# Patient Record
Sex: Male | Born: 1965 | Race: White | Hispanic: No | Marital: Single | Smoking: Current every day smoker
Health system: Southern US, Community
[De-identification: ages and names within clinical notes are randomized; demographics above are authoritative.]

## PROBLEM LIST (undated history)

## (undated) ENCOUNTER — Emergency Department (HOSPITAL_COMMUNITY): Admission: EM | Payer: MEDICAID | Source: Home / Self Care

## (undated) DIAGNOSIS — Z789 Other specified health status: Secondary | ICD-10-CM

## (undated) DIAGNOSIS — J302 Other seasonal allergic rhinitis: Secondary | ICD-10-CM

---

## 2012-02-03 ENCOUNTER — Emergency Department (HOSPITAL_COMMUNITY)
Admission: EM | Admit: 2012-02-03 | Discharge: 2012-02-03 | Discharge status: Against Medical Advice | Payer: Self-pay | Attending: Emergency Medicine | Admitting: Emergency Medicine

## 2012-02-03 ENCOUNTER — Encounter (HOSPITAL_COMMUNITY): Payer: Self-pay | Admitting: *Deleted

## 2012-02-03 DIAGNOSIS — W19XXXA Unspecified fall, initial encounter: Secondary | ICD-10-CM | POA: Insufficient documentation

## 2012-02-03 DIAGNOSIS — S91009A Unspecified open wound, unspecified ankle, initial encounter: Secondary | ICD-10-CM | POA: Insufficient documentation

## 2012-02-03 DIAGNOSIS — S81009A Unspecified open wound, unspecified knee, initial encounter: Secondary | ICD-10-CM | POA: Insufficient documentation

## 2012-02-03 HISTORY — DX: Other seasonal allergic rhinitis: J30.2

## 2012-02-03 NOTE — ED Notes (Signed)
Pt fell tonight, cannot say on what.  ETOH tonight, pt states he has had 6 beers.

## 2012-02-03 NOTE — ED Notes (Signed)
Off duty officer states he saw the patient on the road, did not come back to ED.

## 2015-09-17 ENCOUNTER — Emergency Department (HOSPITAL_COMMUNITY): Admission: EM | Admit: 2015-09-17 | Discharge: 2015-09-18 | Discharge status: Against Medical Advice | Payer: Self-pay

## 2015-09-17 NOTE — ED Notes (Signed)
2nd call for pt with no response. 

## 2015-09-17 NOTE — ED Notes (Signed)
3rd call for pt with no response. 

## 2015-09-17 NOTE — ED Notes (Addendum)
Patient presents from home via EMS left flank pain, fell 6 days ago, ETOH on board. Patient ambulatory to triage.   Last VS: 128/92, 94hr, 100%ra, 20resp.

## 2015-09-19 ENCOUNTER — Emergency Department (HOSPITAL_COMMUNITY)
Admission: EM | Admit: 2015-09-19 | Discharge: 2015-09-19 | Disposition: A | Discharge status: Home / Self Care | Payer: Self-pay

## 2015-09-19 ENCOUNTER — Encounter (HOSPITAL_COMMUNITY): Payer: Self-pay | Admitting: Emergency Medicine

## 2015-09-19 NOTE — ED Notes (Signed)
Per ems, people around called ems for the patient. He fell off porch and landed on his left side a week ago. He came to the ER and was not seen by provider. Friends today called EMS for shortness of breath. Patient reported to be alert and oriented. Non-verbal on arrival. While speaking with EMS, patient exited stretcher, and rapidly walked the ED department, stating "I have to leave now". Inquired where he was going and how he was getting there, and if he would rather see the doctor first before going. Patient declines and repeatedly asked to leave. Assistance with security and [employer]. Patient signed himself out AMA.

## 2018-11-06 ENCOUNTER — Emergency Department (HOSPITAL_COMMUNITY): Payer: Self-pay

## 2018-11-06 ENCOUNTER — Inpatient Hospital Stay (HOSPITAL_COMMUNITY)
Admission: EM | Admit: 2018-11-06 | Discharge: 2018-11-07 | DRG: 896 | Disposition: A | Discharge status: Home / Self Care | Payer: Self-pay | Attending: Internal Medicine | Admitting: Internal Medicine

## 2018-11-06 DIAGNOSIS — F10929 Alcohol use, unspecified with intoxication, unspecified: Secondary | ICD-10-CM

## 2018-11-06 DIAGNOSIS — Z1159 Encounter for screening for other viral diseases: Secondary | ICD-10-CM

## 2018-11-06 DIAGNOSIS — J439 Emphysema, unspecified: Secondary | ICD-10-CM | POA: Diagnosis present

## 2018-11-06 DIAGNOSIS — R9389 Abnormal findings on diagnostic imaging of other specified body structures: Secondary | ICD-10-CM

## 2018-11-06 DIAGNOSIS — R402310 Coma scale, best motor response, none, unspecified time: Secondary | ICD-10-CM | POA: Diagnosis present

## 2018-11-06 DIAGNOSIS — R402210 Coma scale, best verbal response, none, unspecified time: Secondary | ICD-10-CM | POA: Diagnosis present

## 2018-11-06 DIAGNOSIS — F10129 Alcohol abuse with intoxication, unspecified: Principal | ICD-10-CM | POA: Diagnosis present

## 2018-11-06 DIAGNOSIS — Y908 Blood alcohol level of 240 mg/100 ml or more: Secondary | ICD-10-CM | POA: Diagnosis present

## 2018-11-06 DIAGNOSIS — R4182 Altered mental status, unspecified: Secondary | ICD-10-CM | POA: Diagnosis present

## 2018-11-06 DIAGNOSIS — E876 Hypokalemia: Secondary | ICD-10-CM | POA: Diagnosis present

## 2018-11-06 DIAGNOSIS — F10921 Alcohol use, unspecified with intoxication delirium: Secondary | ICD-10-CM

## 2018-11-06 DIAGNOSIS — J810 Acute pulmonary edema: Secondary | ICD-10-CM | POA: Diagnosis present

## 2018-11-06 DIAGNOSIS — J9601 Acute respiratory failure with hypoxia: Secondary | ICD-10-CM

## 2018-11-06 DIAGNOSIS — R0603 Acute respiratory distress: Secondary | ICD-10-CM | POA: Diagnosis present

## 2018-11-06 DIAGNOSIS — R402110 Coma scale, eyes open, never, unspecified time: Secondary | ICD-10-CM | POA: Diagnosis present

## 2018-11-06 LAB — COMPREHENSIVE METABOLIC PANEL
ALT: 50 U/L — ABNORMAL HIGH (ref 0–44)
AST: 117 U/L — ABNORMAL HIGH (ref 15–41)
Albumin: 3.8 g/dL (ref 3.5–5.0)
Alkaline Phosphatase: 86 U/L (ref 38–126)
Anion gap: 13 (ref 5–15)
BUN: <5 mg/dL — ABNORMAL LOW (ref 6–20)
CO2: 19 mmol/L — ABNORMAL LOW (ref 22–32)
Calcium: 8.8 mg/dL — ABNORMAL LOW (ref 8.9–10.3)
Chloride: 107 mmol/L (ref 98–111)
Creatinine, Ser: 0.69 mg/dL (ref 0.61–1.24)
GFR calc Af Amer: >60 mL/min (ref >60)
GFR calc non Af Amer: >60 mL/min (ref >60)
Glucose, Bld: 93 mg/dL (ref 70–99)
Potassium: 5 mmol/L (ref 3.5–5.1)
Sodium: 139 mmol/L (ref 135–145)
Total Bilirubin: 0.8 mg/dL (ref 0.3–1.2)
Total Protein: 7.7 g/dL (ref 6.5–8.1)

## 2018-11-06 LAB — CBC WITH DIFFERENTIAL/PLATELET
Abs Immature Granulocytes: 0.11 10*3/uL — ABNORMAL HIGH (ref 0.00–0.07)
Basophils Absolute: 0.1 10*3/uL (ref 0.0–0.1)
Basophils Relative: 1 %
Eosinophils Absolute: 0.2 10*3/uL (ref 0.0–0.5)
Eosinophils Relative: 3 %
HCT: 49.7 % (ref 39.0–52.0)
Hemoglobin: 16.3 g/dL (ref 13.0–17.0)
Immature Granulocytes: 2 %
Lymphocytes Relative: 40 %
Lymphs Abs: 2.9 10*3/uL (ref 0.7–4.0)
MCH: 34.5 pg — ABNORMAL HIGH (ref 26.0–34.0)
MCHC: 32.8 g/dL (ref 30.0–36.0)
MCV: 105.1 fL — ABNORMAL HIGH (ref 80.0–100.0)
Monocytes Absolute: 1.4 10*3/uL — ABNORMAL HIGH (ref 0.1–1.0)
Monocytes Relative: 19 %
Neutro Abs: 2.6 10*3/uL (ref 1.7–7.7)
Neutrophils Relative %: 35 %
Platelets: DECREASED 10*3/uL (ref 150–400)
RBC: 4.73 MIL/uL (ref 4.22–5.81)
RDW: 13.9 % (ref 11.5–15.5)
WBC: 7.3 10*3/uL (ref 4.0–10.5)
nRBC: 0 % (ref 0.0–0.2)

## 2018-11-06 LAB — POCT I-STAT 7, (LYTES, BLD GAS, ICA,H+H)
Acid-base deficit: 4 mmol/L — ABNORMAL HIGH (ref 0.0–2.0)
Acid-base deficit: 9 mmol/L — ABNORMAL HIGH (ref 0.0–2.0)
Bicarbonate: 23 mmol/L (ref 20.0–28.0)
Bicarbonate: 16.4 mmol/L — ABNORMAL LOW (ref 20.0–28.0)
Calcium, Ion: 1.11 mmol/L — ABNORMAL LOW (ref 1.15–1.40)
Calcium, Ion: 1.07 mmol/L — ABNORMAL LOW (ref 1.15–1.40)
HCT: 50 % (ref 39.0–52.0)
HCT: 43 % (ref 39.0–52.0)
Hemoglobin: 17 g/dL (ref 13.0–17.0)
Hemoglobin: 14.6 g/dL (ref 13.0–17.0)
O2 Saturation: 100 %
O2 Saturation: 99 %
Patient temperature: 91.4
Patient temperature: 97.1
Potassium: 3.7 mmol/L (ref 3.5–5.1)
Potassium: 3.5 mmol/L (ref 3.5–5.1)
Sodium: 140 mmol/L (ref 135–145)
Sodium: 144 mmol/L (ref 135–145)
TCO2: 24 mmol/L (ref 22–32)
TCO2: 17 mmol/L — ABNORMAL LOW (ref 22–32)
pCO2 arterial: 41.4 mmHg (ref 32.0–48.0)
pCO2 arterial: 32.1 mmHg (ref 32.0–48.0)
pH, Arterial: 7.331 — ABNORMAL LOW (ref 7.350–7.450)
pH, Arterial: 7.314 — ABNORMAL LOW (ref 7.350–7.450)
pO2, Arterial: 426 mmHg — ABNORMAL HIGH (ref 83.0–108.0)
pO2, Arterial: 159 mmHg — ABNORMAL HIGH (ref 83.0–108.0)

## 2018-11-06 LAB — RAPID URINE DRUG SCREEN, HOSP PERFORMED
Amphetamines: NOT DETECTED
Barbiturates: NOT DETECTED
Benzodiazepines: NOT DETECTED
Cocaine: NOT DETECTED
Opiates: NOT DETECTED
Tetrahydrocannabinol: NOT DETECTED

## 2018-11-06 LAB — CBG MONITORING, ED
Glucose-Capillary: 112 mg/dL — ABNORMAL HIGH (ref 70–99)
Glucose-Capillary: 98 mg/dL (ref 70–99)

## 2018-11-06 LAB — TRIGLYCERIDES: Triglycerides: 47 mg/dL (ref <150)

## 2018-11-06 LAB — ETHANOL: Alcohol, Ethyl (B): 415 mg/dL (ref <10)

## 2018-11-06 LAB — SARS CORONAVIRUS 2: SARS Coronavirus 2: NOT DETECTED

## 2018-11-06 MED ORDER — THIAMINE HCL 100 MG/ML IJ SOLN
Freq: Once | INTRAVENOUS | Status: AC
  Administered 2018-11-06: 20:00:00 via INTRAVENOUS
  Filled 2018-11-06: qty 1000

## 2018-11-06 MED ORDER — DEXMEDETOMIDINE HCL IN NACL 200 MCG/50ML IV SOLN
0.2000 ug/kg/h | INTRAVENOUS | Status: DC
  Administered 2018-11-06 (×2): 0.2 ug/kg/h via INTRAVENOUS
  Administered 2018-11-07: 03:00:00 0.271 ug/kg/h via INTRAVENOUS
  Filled 2018-11-06 (×4): qty 50

## 2018-11-06 MED ORDER — FOLIC ACID 1 MG PO TABS
1.0000 mg | ORAL_TABLET | Freq: Every day | ORAL | Status: DC
  Administered 2018-11-07: 11:00:00 1 mg
  Filled 2018-11-06: qty 1

## 2018-11-06 MED ORDER — PROPOFOL 1000 MG/100ML IV EMUL: 0.0000 ug/kg/min | INTRAVENOUS | Status: DC

## 2018-11-06 MED ORDER — FENTANYL BOLUS VIA INFUSION
50.0000 ug | INTRAVENOUS | Status: DC | PRN
  Filled 2018-11-06: qty 50

## 2018-11-06 MED ORDER — FENTANYL CITRATE (PF) 100 MCG/2ML IJ SOLN: 100.0000 ug | INTRAMUSCULAR | Status: DC | PRN

## 2018-11-06 MED ORDER — LORAZEPAM 2 MG/ML IJ SOLN
1.0000 mg | INTRAMUSCULAR | Status: DC | PRN
  Administered 2018-11-06: 2 mg via INTRAVENOUS
  Filled 2018-11-06: qty 1

## 2018-11-06 MED ORDER — ETOMIDATE 2 MG/ML IV SOLN
INTRAVENOUS | Status: AC | PRN
  Administered 2018-11-06: 20 mg via INTRAVENOUS

## 2018-11-06 MED ORDER — FENTANYL CITRATE (PF) 100 MCG/2ML IJ SOLN
50.0000 ug | Freq: Once | INTRAMUSCULAR | Status: AC
  Administered 2018-11-06: 15:00:00 50 ug via INTRAVENOUS

## 2018-11-06 MED ORDER — ADULT MULTIVITAMIN W/MINERALS CH
1.0000 | ORAL_TABLET | Freq: Every day | ORAL | Status: DC
  Administered 2018-11-07: 11:00:00 1
  Filled 2018-11-06: qty 1

## 2018-11-06 MED ORDER — NOREPINEPHRINE 4 MG/250ML-% IV SOLN
0.0000 ug/min | INTRAVENOUS | Status: DC
  Administered 2018-11-06: 15:00:00 2 ug/min via INTRAVENOUS

## 2018-11-06 MED ORDER — CALCIUM GLUCONATE-NACL 1-0.675 GM/50ML-% IV SOLN
1.0000 g | Freq: Once | INTRAVENOUS | Status: AC
  Administered 2018-11-06: 16:00:00 1000 mg via INTRAVENOUS
  Filled 2018-11-06: qty 50

## 2018-11-06 MED ORDER — SODIUM CHLORIDE 0.9 % IV SOLN
INTRAVENOUS | Status: DC
  Administered 2018-11-06: 12:00:00 via INTRAVENOUS

## 2018-11-06 MED ORDER — VITAMIN B-1 100 MG PO TABS
100.0000 mg | ORAL_TABLET | Freq: Every day | ORAL | Status: DC
  Administered 2018-11-07: 11:00:00 100 mg
  Filled 2018-11-06: qty 1

## 2018-11-06 MED ORDER — FENTANYL 2500MCG IN NS 250ML (10MCG/ML) PREMIX INFUSION
25.0000 ug/h | INTRAVENOUS | Status: DC
  Administered 2018-11-06: 15:00:00 50 ug/h via INTRAVENOUS
  Filled 2018-11-06: qty 250

## 2018-11-06 MED ORDER — MIDAZOLAM HCL 2 MG/2ML IJ SOLN
1.0000 mg | INTRAMUSCULAR | Status: DC | PRN
  Administered 2018-11-06: 16:00:00 2 mg via INTRAVENOUS
  Filled 2018-11-06: qty 2

## 2018-11-06 MED ORDER — PANTOPRAZOLE SODIUM 40 MG IV SOLR
40.0000 mg | Freq: Every day | INTRAVENOUS | Status: DC
  Administered 2018-11-06: 22:00:00 40 mg via INTRAVENOUS
  Filled 2018-11-06: qty 40

## 2018-11-06 MED ORDER — ROCURONIUM BROMIDE 50 MG/5ML IV SOLN
INTRAVENOUS | Status: AC | PRN
  Administered 2018-11-06: 80 mg via INTRAVENOUS

## 2018-11-06 MED ORDER — HEPARIN SODIUM (PORCINE) 5000 UNIT/ML IJ SOLN
5000.0000 [IU] | Freq: Three times a day (TID) | INTRAMUSCULAR | Status: DC
  Administered 2018-11-06 – 2018-11-07 (×2): 5000 [IU] via SUBCUTANEOUS
  Filled 2018-11-06 (×2): qty 1

## 2018-11-06 NOTE — ED Provider Notes (Signed)
[facility]   CSN: [id number] Arrival date & time: 11/06/18  1133     History   Chief Complaint Chief Complaint  Patient presents with  . Unresponsive    HPI Randall Garcia is a [age] y.o. male.     This is a homeless male who presents with via EMS due to being found unresponsive.  Patient is a known alcoholic and was last seen drinking alcohol sometime ago.  Bystander called EMS due to not be able to arouse him.  Per EMS patient CBG was above 100.  The patient had a nasopharyngeal airway inserted and pulse oximetry and blood pressure and heart rate were stable.  Transported here for further management     No past medical history on file.  There are no active problems to display for this patient.         Home Medications    Prior to Admission medications   Not on File    Family History No family history on file.  Social History Social History   Tobacco Use  . Smoking status: Not on file  Substance Use Topics  . Alcohol use: Not on file  . Drug use: Not on file     Allergies   Patient has no known allergies.   Review of Systems Review of Systems  Unable to perform ROS: Acuity of condition     Physical Exam Updated Vital Signs BP (!) 159/100   Pulse 88   Temp (!) 93.2 F (34 C)   Resp 18   SpO2 100%   Physical Exam Vitals signs and nursing note reviewed.  Constitutional:      General: He is not in acute distress.    Appearance: Normal appearance. He is well-developed. He is not toxic-appearing.  HENT:     Head: Normocephalic and atraumatic.  Eyes:     General: Lids are normal.     Conjunctiva/sclera: Conjunctivae normal.     Pupils: Pupils are equal, round, and reactive to light.  Neck:     Musculoskeletal: Normal range of motion and neck supple.     Thyroid: No thyroid mass.     Trachea: No tracheal deviation.  Cardiovascular:     Rate and Rhythm: Regular rhythm. Tachycardia  present.     Heart sounds: Normal heart sounds. No murmur. No gallop.   Pulmonary:     Effort: Pulmonary effort is normal. No respiratory distress.     Breath sounds: Normal breath sounds. No stridor. No decreased breath sounds, wheezing, rhonchi or rales.  Abdominal:     General: Bowel sounds are normal. There is no distension.     Palpations: Abdomen is soft.     Tenderness: There is no abdominal tenderness. There is no rebound.  Musculoskeletal: Normal range of motion.        General: No tenderness.  Skin:    General: Skin is warm and dry.     Findings: No abrasion or rash.  Neurological:     Mental Status: He is unresponsive.     GCS: GCS eye subscore is 1. GCS verbal subscore is 1. GCS motor subscore is 1.  Psychiatric:        Attention and Perception: He is inattentive.      ED Treatments / Results  Labs (all labs ordered are listed, but only abnormal results are displayed) Labs Reviewed  SARS CORONAVIRUS 2 (HOSPITAL ORDER, PERFORMED IN [facility])  CBC WITH  DIFFERENTIAL/PLATELET  COMPREHENSIVE METABOLIC PANEL  ETHANOL  RAPID URINE DRUG SCREEN, HOSP PERFORMED  BLOOD GAS, ARTERIAL  CBG MONITORING, ED    EKG    Radiology No results found.  Procedures Procedure Name: Intubation Date/Time: 11/06/2018 12:38 PM Performed by: Lacretia Leigh, MD Pre-anesthesia Checklist: Patient identified, Patient being monitored, Emergency Drugs available, Timeout performed and Suction available Oxygen Delivery Method: Non-rebreather mask Preoxygenation: Pre-oxygenation with 100% oxygen Induction Type: Rapid sequence Ventilation: Mask ventilation without difficulty Laryngoscope Size: Glidescope and 2 Grade View: Grade I Tube size: 7.5 mm Number of attempts: 1 Airway Equipment and Method: Video-laryngoscopy Placement Confirmation: ETT inserted through vocal cords under direct vision,  CO2 detector and Breath sounds checked- equal and bilateral Secured at: 26  cm Tube secured with: ETT holder Dental Injury: Teeth and Oropharynx as per pre-operative assessment  Difficulty Due To: Difficulty was anticipated      (including critical care time)  Medications Ordered in ED Medications  0.9 %  sodium chloride infusion ( Intravenous New Bag/Given 11/06/18 1150)     Initial Impression / Assessment and Plan / ED Course  I have reviewed the triage vital signs and the nursing notes.  Pertinent labs & imaging results that were available during my care of the patient were reviewed by me and considered in my medical decision making (see chart for details).        Patient arrived by EMS with unresponsiveness.  Patient had a blood gas which showed no signs of CO2 retention and adequate oxygenation.  Attempted to take patient off oxygen and he then desatted.  He was bagged by nursing easily.  Sats became stable once more.  Decided to intubate the patient for airway protection.  Please see above note.  Patient's COVID test is negative.  His alcohol level is severely elevated at 415.  Will check head and C-spine CTs.  Will consult critical care for admission   CRITICAL CARE Performed by: Leota Jacobsen Total critical care time: 65 minutes Critical care time was exclusive of separately billable procedures and treating other patients. Critical care was necessary to treat or prevent imminent or life-threatening deterioration. Critical care was time spent personally by me on the following activities: development of treatment plan with patient and/or surrogate as well as nursing, discussions with consultants, evaluation of patient's response to treatment, examination of patient, obtaining history from patient or surrogate, ordering and performing treatments and interventions, ordering and review of laboratory studies, ordering and review of radiographic studies, pulse oximetry and re-evaluation of patient's condition.  Randall Garcia was evaluated in Emergency  Department on 11/06/2018 for the symptoms described in the history of present illness. He was evaluated in the context of the global COVID-19 pandemic, which necessitated consideration that the patient might be at risk for infection with the SARS-CoV-2 virus that causes COVID-19. Institutional protocols and algorithms that pertain to the evaluation of patients at risk for COVID-19 are in a state of rapid change based on information released by regulatory bodies including the CDC and federal and state organizations. These policies and algorithms were followed during the patient's care in the ED.    Final Clinical Impressions(s) / ED Diagnoses   Final diagnoses:  None    ED Discharge Orders    None       Lacretia Leigh, MD 11/06/18 1326

## 2018-11-06 NOTE — Code Documentation (Signed)
PREPARING FOR INTUBATION

## 2018-11-06 NOTE — H&P (Signed)
NAME:  Randall Garcia, MRN:  [id number], DOB:  12/15/1965, LOS: 0 ADMISSION DATE:  11/06/2018, CONSULTATION DATE:  11/06/18 REFERRING MD:  Zenia Resides  CHIEF COMPLAINT:  AMS   Brief History   Randall Garcia is a 53 y.o. male who is homeless and was brought to ED after being found unresponsive.  He required intubation for airway protection.  History of present illness   Pt is encephelopathic; therefore, this HPI is obtained from chart review. Randall Garcia is a 53 y.o. male who has no known significant PMH and who is homeless.  He presented to [facility] ED 6/16 after being found unresponsive.  He was last seen normal 1 hour prior when he was seen drinking some alcohol.  He was apparently found by his girlfriend who called EMS and upon their arrival, he was agonal.  He was brought to the ED where he was intubated for airway protection.  CT head and c-spine were negative for acute process though did show emphysema as well as incidental finding of irregular nodules in the apices.  Past Medical History  Emphysema per CT, irregular pulmonary nodules.  Significant Hospital Events   6/16 > admit.  Consults:  None.  Procedures:  ETT 6/16 >   Significant Diagnostic Tests:  CT head / c-spine 6/16 > negative for acute process though did show emphysema as well as incidental finding of irregular nodules in the apices.  Micro Data:  SARS CoV2 6/16 > neg. Sputum 6/16 >   Antimicrobials:  None.   Interim history/subjective:  Sedated, comfortable.  Objective:  Blood pressure 95/66, pulse 91, temperature (!) 95.2 F (35.1 C), resp. rate 16, height 6' (1.829 m), SpO2 100 %.    Vent Mode: PRVC FiO2 (%):  [60 %] 60 % Set Rate:  [16 bmp] 16 bmp Vt Set:  [620 mL] 620 mL PEEP:  [5 cmH20] 5 cmH20 Plateau Pressure:  [9 cmH20] 9 cmH20  No intake or output data in the 24 hours ending 11/06/18 1443 There were no vitals filed for this visit.  Examination: General: Adult male, disheveled, in NAD. Neuro: Sedated,  unresponsive.  Withdraws to pain. HEENT: [place]/AT. Sclerae anicteric.  ETT in place. Cardiovascular: RRR, no M/R/G.  Lungs: Respirations even and unlabored.  CTA bilaterally, No W/R/R.  Abdomen: BS x 4, soft, NT/ND.  Musculoskeletal: No gross deformities, no edema.  Skin: Poor hygiene most notably to bilateral lower feet, warm, no rashes.  Assessment & Plan:   Respiratory insufficiency - due to inability to protect the airway in the setting of AMS likely 2/2 to EtOH intoxication. - Full vent support. - Assess ABG. - Wean as mental status allows. - Daily SBT. - Bronchial hygiene. - Follow CXR.  AMS - unclear etiology but favor EtOH intoxication (EtOH level 415 on admit).  UDS and CT head negative. - Supportive care. - Precedex gtt / Midazolam PRN. - RASS goal 0 to -1. - Thiamine / Folate / Multivitamin. - EtOH cessation counseling.  Hypocalcemia. - 1g Ca gluconate.   Best Practice:  Diet: NPO. Pain/Anxiety/Delirium protocol (if indicated): Precedex gtt / Fentanyl PRN / Midazolam PRN.  RASS goal -1. VAP protocol (if indicated): In place. DVT prophylaxis: SCD's / Heparin. GI prophylaxis: PPI. Glucose control: None. Mobility: Bedrest. Code Status: Full. Family Communication: None available. Disposition: ICU.  Labs   CBC: Recent Labs  Lab 11/06/18 1150 11/06/18 1156  WBC 7.3  --   NEUTROABS 2.6  --   HGB 16.3 17.0  HCT 49.7 50.0  MCV 105.1*  --   PLT PLATELET CLUMPS NOTED ON SMEAR, COUNT APPEARS DECREASED  --    Basic Metabolic Panel: Recent Labs  Lab 11/06/18 1150 11/06/18 1156  NA 139 140  K 5.0 3.7  CL 107  --   CO2 19*  --   GLUCOSE 93  --   BUN <5*  --   CREATININE 0.69  --   CALCIUM 8.8*  --    GFR: CrCl cannot be calculated (Unknown ideal weight.). Recent Labs  Lab 11/06/18 1150  WBC 7.3   Liver Function Tests: Recent Labs  Lab 11/06/18 1150  AST 117*  ALT 50*  ALKPHOS 86  BILITOT 0.8  PROT 7.7  ALBUMIN 3.8   No results for  input(s): LIPASE, AMYLASE in the last 168 hours. No results for input(s): AMMONIA in the last 168 hours. ABG    Component Value Date/Time   PHART 7.331 (L) 11/06/2018 1156   PCO2ART 41.4 11/06/2018 1156   PO2ART 426.0 (H) 11/06/2018 1156   HCO3 23.0 11/06/2018 1156   TCO2 24 11/06/2018 1156   ACIDBASEDEF 4.0 (H) 11/06/2018 1156   O2SAT 100.0 11/06/2018 1156    Coagulation Profile: No results for input(s): INR, PROTIME in the last 168 hours. Cardiac Enzymes: No results for input(s): CKTOTAL, CKMB, CKMBINDEX, TROPONINI in the last 168 hours. HbA1C: No results found for: HGBA1C CBG: Recent Labs  Lab 11/06/18 1145 11/06/18 1220  GLUCAP 98 112*    Review of Systems:   Unable to obtain as pt is encephalopathic.  Past medical history  He,  has no past medical history on file.   Surgical History   Not known  Social History      Family history   His family history is not on file.   Allergies No Known Allergies   Home meds  Prior to Admission medications   Not on File    Critical care time: 30 min.    [name], PA [facility] Pager: [phone].  If no answer, [phone] 11/06/2018, 2:43 PM

## 2018-11-06 NOTE — ED Notes (Signed)
ED TO INPATIENT HANDOFF REPORT  ED Nurse Name and Phone #: Merry ProudBrandi [phone]  S Name/Age/Gender Randall Garcia 53 y.o. male Room/Bed: TRACC/TRACC  Code Status   Code Status: Full Code  Home/SNF/Other Home/Homeless Patient oriented to:  disoriented, intubated.  Is this baseline? No   Triage Complete: Triage complete  Chief Complaint unresponsive  Triage Note Patient presents to the ED by EMS with unresponsive pt. LSW 1 hour ago, known hx of ETOH last drink 1 hr ago. Found by bystander (girlfriend). Pupils dilated 5/6. Initially agonal respirations being assisted by bag now on NRB. No trauma noted.    Allergies No Known Allergies  Level of Care/Admitting Diagnosis ED Disposition    ED Disposition Condition Comment   Admit  [facility]ospital [facility] [[id number]]  Level of Care: ICU [6]  Covid Evaluation: Confirmed COVID Negative  Diagnosis: Altered mental status [780.97.ICD-9-CM]  Admitting Physician: Lorin GlassSMITH, DANIEL C [[id number]]  Attending Physician: PCCM, MD [phone][[id number]]  Estimated length of stay: 3 - 4 days  Certification:: I certify this patient will need inpatient services for at least 2 midnights  PT Class (Do Not Modify): Inpatient [101]  PT Acc Code (Do Not Modify): Private [1]       B Medical/Surgery History No past medical history on file.    A IV Location/Drains/Wounds Patient Lines/Drains/Airways Status   Active Line/Drains/Airways    Name:   Placement date:   Placement time:   Site:   Days:   Peripheral IV 11/06/18 Left Antecubital   11/06/18    1141    Antecubital   less than 1   Peripheral IV 11/06/18 Right Hand   11/06/18    1142    Hand   less than 1   NG/OG Tube Orogastric 14 Fr. Right mouth Xray;Aucultation Documented cm marking at nare/ corner of mouth   11/06/18    1233    Right mouth   less than 1   Urethral Catheter Brianne Davis RN  Temperature probe 14 Fr.   11/06/18    1147    Temperature probe   less than 1   Airway 7.5 mm    11/06/18    1235     less than 1          Intake/Output Last 24 hours  Intake/Output Summary (Last 24 hours) at 11/06/2018 1732 Last data filed at 11/06/2018 1715 Gross per 24 hour  Intake 49.02 ml  Output 500 ml  Net -450.98 ml    Labs/Imaging Results for orders placed or performed during the hospital encounter of 11/06/18 (from the past 48 hour(s))  Rapid urine drug screen (hospital performed)     Status: None   Collection Time: 11/06/18 11:44 AM  Result Value Ref Range   Opiates NONE DETECTED NONE DETECTED   Cocaine NONE DETECTED NONE DETECTED   Benzodiazepines NONE DETECTED NONE DETECTED   Amphetamines NONE DETECTED NONE DETECTED   Tetrahydrocannabinol NONE DETECTED NONE DETECTED   Barbiturates NONE DETECTED NONE DETECTED    Comment: (NOTE) DRUG SCREEN FOR MEDICAL PURPOSES ONLY.  IF CONFIRMATION IS NEEDED FOR ANY PURPOSE, NOTIFY LAB WITHIN 5 DAYS. LOWEST DETECTABLE LIMITS FOR URINE DRUG SCREEN Drug Class                     Cutoff (ng/mL) Amphetamine and metabolites    1000 Barbiturate and metabolites    200 Benzodiazepine  200 Tricyclics and metabolites     300 Opiates and metabolites        300 Cocaine and metabolites        300 THC                            50 Performed at [facility], [address] [place], [place] [address]   CBG monitoring, ED     Status: None   Collection Time: 11/06/18 11:45 AM  Result Value Ref Range   Glucose-Capillary 98 70 - 99 mg/dL  CBC with Differential/Platelet     Status: Abnormal   Collection Time: 11/06/18 11:50 AM  Result Value Ref Range   WBC 7.3 4.0 - 10.5 K/uL   RBC 4.73 4.22 - 5.81 MIL/uL   Hemoglobin 16.3 13.0 - 17.0 g/dL   HCT 40.949.7 81.139.0 - 91.452.0 %   MCV 105.1 (H) 80.0 - 100.0 fL   MCH 34.5 (H) 26.0 - 34.0 pg   MCHC 32.8 30.0 - 36.0 g/dL   RDW 78.213.9 95.611.5 - 21.315.5 %   Platelets  150 - 400 K/uL    PLATELET CLUMPS NOTED ON SMEAR, COUNT APPEARS DECREASED    Comment: Immature Platelet Fraction may  be clinically indicated, consider ordering this additional test [id number]    nRBC 0.0 0.0 - 0.2 %   Neutrophils Relative % 35 %   Neutro Abs 2.6 1.7 - 7.7 K/uL   Lymphocytes Relative 40 %   Lymphs Abs 2.9 0.7 - 4.0 K/uL   Monocytes Relative 19 %   Monocytes Absolute 1.4 (H) 0.1 - 1.0 K/uL   Eosinophils Relative 3 %   Eosinophils Absolute 0.2 0.0 - 0.5 K/uL   Basophils Relative 1 %   Basophils Absolute 0.1 0.0 - 0.1 K/uL   Immature Granulocytes 2 %   Abs Immature Granulocytes 0.11 (H) 0.00 - 0.07 K/uL    Comment: Performed at [facility], [address] [place], [place] [address]  Comprehensive metabolic panel     Status: Abnormal   Collection Time: 11/06/18 11:50 AM  Result Value Ref Range   Sodium 139 135 - 145 mmol/L   Potassium 5.0 3.5 - 5.1 mmol/L   Chloride 107 98 - 111 mmol/L   CO2 19 (L) 22 - 32 mmol/L   Glucose, Bld 93 70 - 99 mg/dL   BUN <5 (L) 6 - 20 mg/dL   Creatinine, Ser 2.840.69 0.61 - 1.24 mg/dL   Calcium 8.8 (L) 8.9 - 10.3 mg/dL   Total Protein 7.7 6.5 - 8.1 g/dL   Albumin 3.8 3.5 - 5.0 g/dL   AST 132117 (H) 15 - 41 U/L   ALT 50 (H) 0 - 44 U/L   Alkaline Phosphatase 86 38 - 126 U/L   Total Bilirubin 0.8 0.3 - 1.2 mg/dL   GFR calc non Af Amer >60 >60 mL/min   GFR calc Af Amer >60 >60 mL/min   Anion gap 13 5 - 15    Comment: Performed at [facility], [address] [place], [place] [address]  Ethanol     Status: Abnormal   Collection Time: 11/06/18 11:50 AM  Result Value Ref Range   Alcohol, Ethyl (B) 415 (HH) <10 mg/dL    Comment: CRITICAL RESULT CALLED TO, READ BACK BY AND VERIFIED WITH: B Nyal Schachter RN [phone] [id number] BY A BENNETT (NOTE) Lowest detectable limit for serum alcohol is 10 mg/dL. For medical  purposes only. Performed at [facility], [address], [place], [place] [address]   SARS Coronavirus 2     Status: None   Collection Time: 11/06/18 11:54 AM  Result Value Ref Range   SARS Coronavirus 2 NOT DETECTED NOT DETECTED     Comment: (NOTE) SARS-CoV-2 target nucleic acids are NOT DETECTED. The SARS-CoV-2 RNA is generally detectable in upper and lower respiratory specimens during the acute phase of infection.  Negative  results do not preclude SARS-CoV-2 infection, do not rule out co-infections with other pathogens, and should not be used as the sole basis for treatment or other patient management decisions.  Negative results must be combined with clinical observations, patient history, and epidemiological information. The expected result is Not Detected. Fact Sheet for Patients: http://www.biofiredefense.com/wp-content/uploads/2020/03/BIOFIRE-COVID -19-patients.pdf Fact Sheet for Healthcare Providers: http://www.biofiredefense.com/wp-content/uploads/2020/03/BIOFIRE-COVID -19-hcp.pdf This test is not yet approved or cleared by the [place] and  has been authorized for detection and/or diagnosis of SARS-CoV-2 by FDA under an Emergency Use Authorization (EUA).  This EUA will remain in effec t (meaning this test can be used) for the duration of  the COVID-19 declaration under Section 564(b)(1) of the Act, 21 U.S.C. section 360bbb-3(b)(1), unless the authorization is terminated or revoked sooner. Performed at [facility], [address], [place], [place] [address]   I-STAT 7, (LYTES, BLD GAS, ICA, H+H)     Status: Abnormal   Collection Time: 11/06/18 11:56 AM  Result Value Ref Range   pH, Arterial 7.331 (L) 7.350 - 7.450   pCO2 arterial 41.4 32.0 - 48.0 mmHg   pO2, Arterial 426.0 (H) 83.0 - 108.0 mmHg   Bicarbonate 23.0 20.0 - 28.0 mmol/L   TCO2 24 22 - 32 mmol/L   O2 Saturation 100.0 %   Acid-base deficit 4.0 (H) 0.0 - 2.0 mmol/L   Sodium 140 135 - 145 mmol/L   Potassium 3.7 3.5 - 5.1 mmol/L   Calcium, Ion 1.11 (L) 1.15 - 1.40 mmol/L   HCT 50.0 39.0 - 52.0 %   Hemoglobin 17.0 13.0 - 17.0 g/dL   Patient temperature 09.8 F    Collection site RADIAL, ALLEN'S TEST ACCEPTABLE    Sample  type ARTERIAL   CBG monitoring, ED     Status: Abnormal   Collection Time: 11/06/18 12:20 PM  Result Value Ref Range   Glucose-Capillary 112 (H) 70 - 99 mg/dL  Triglycerides     Status: None   Collection Time: 11/06/18  3:28 PM  Result Value Ref Range   Triglycerides 47 <150 mg/dL    Comment: Performed at [facility], [address] [place], [place] [address]  I-STAT 7, (LYTES, BLD GAS, ICA, H+H)     Status: Abnormal   Collection Time: 11/06/18  4:25 PM  Result Value Ref Range   pH, Arterial 7.314 (L) 7.350 - 7.450   pCO2 arterial 32.1 32.0 - 48.0 mmHg   pO2, Arterial 159.0 (H) 83.0 - 108.0 mmHg   Bicarbonate 16.4 (L) 20.0 - 28.0 mmol/L   TCO2 17 (L) 22 - 32 mmol/L   O2 Saturation 99.0 %   Acid-base deficit 9.0 (H) 0.0 - 2.0 mmol/L   Sodium 144 135 - 145 mmol/L   Potassium 3.5 3.5 - 5.1 mmol/L   Calcium, Ion 1.07 (L) 1.15 - 1.40 mmol/L   HCT 43.0 39.0 - 52.0 %   Hemoglobin 14.6 13.0 - 17.0 g/dL   Patient temperature 78.2 F    Collection site RADIAL, ALLEN'S  TEST ACCEPTABLE    Sample type ARTERIAL    Ct Head Wo Contrast  Result Date: 11/06/2018 CLINICAL DATA:  Found down EXAM: CT HEAD WITHOUT CONTRAST CT CERVICAL SPINE WITHOUT CONTRAST TECHNIQUE: Multidetector CT imaging of the head and cervical spine was performed following the standard protocol without intravenous contrast. Multiplanar CT image reconstructions of the cervical spine were also generated. COMPARISON:  None. FINDINGS: CT HEAD FINDINGS Brain: No evidence of acute infarction, hemorrhage, hydrocephalus, extra-axial collection or mass lesion/mass effect. Vascular: No hyperdense vessel or unexpected calcification. Skull: Normal. Negative for fracture or focal lesion. Sinuses/Orbits: No acute finding. Other: None. CT CERVICAL SPINE FINDINGS Alignment: Normal. Skull base and vertebrae: No acute fracture. No primary bone lesion or focal pathologic process. Soft tissues and spinal canal: No prevertebral fluid or swelling.  No visible canal hematoma. Disc levels:  Intact. Upper chest: Emphysema. Irregular nodules of the included bilateral lung apices, the largest in the left apex measuring at least 1.5 cm. Other: None. IMPRESSION: 1.  No acute intracranial pathology. 2.  No fracture or static subluxation of the cervical spine. 3. Emphysema. Irregular nodules of the included bilateral lung apices, the largest in the left apex measuring at least 1.5 cm. Recommend dedicated CT examination of the chest on a nonemergent basis when clinically appropriate. Electronically Signed   By: Eddie Candle M.D.   On: 11/06/2018 13:59   Ct Cervical Spine Wo Contrast  Result Date: 11/06/2018 CLINICAL DATA:  Found down EXAM: CT HEAD WITHOUT CONTRAST CT CERVICAL SPINE WITHOUT CONTRAST TECHNIQUE: Multidetector CT imaging of the head and cervical spine was performed following the standard protocol without intravenous contrast. Multiplanar CT image reconstructions of the cervical spine were also generated. COMPARISON:  None. FINDINGS: CT HEAD FINDINGS Brain: No evidence of acute infarction, hemorrhage, hydrocephalus, extra-axial collection or mass lesion/mass effect. Vascular: No hyperdense vessel or unexpected calcification. Skull: Normal. Negative for fracture or focal lesion. Sinuses/Orbits: No acute finding. Other: None. CT CERVICAL SPINE FINDINGS Alignment: Normal. Skull base and vertebrae: No acute fracture. No primary bone lesion or focal pathologic process. Soft tissues and spinal canal: No prevertebral fluid or swelling. No visible canal hematoma. Disc levels:  Intact. Upper chest: Emphysema. Irregular nodules of the included bilateral lung apices, the largest in the left apex measuring at least 1.5 cm. Other: None. IMPRESSION: 1.  No acute intracranial pathology. 2.  No fracture or static subluxation of the cervical spine. 3. Emphysema. Irregular nodules of the included bilateral lung apices, the largest in the left apex measuring at least 1.5  cm. Recommend dedicated CT examination of the chest on a nonemergent basis when clinically appropriate. Electronically Signed   By: Eddie Candle M.D.   On: 11/06/2018 13:59   Dg Chest Port 1 View  Result Date: 11/06/2018 CLINICAL DATA:  Cough, respiratory distress EXAM: PORTABLE CHEST 1 VIEW COMPARISON:  None. FINDINGS: Endotracheal tube with the tip 3.6 cm above the carina. No focal consolidation, pleural effusion or pneumothorax. Stable cardiomediastinal silhouette. Nasogastric tube projecting over the stomach. Bilateral nondisplaced healed posterior rib fractures. IMPRESSION: 1. Endotracheal tube with the tip 3.6 cm above the carina. Electronically Signed   By: Kathreen Devoid   On: 11/06/2018 13:11    Pending Labs Unresulted Labs (From admission, onward)    Start     Ordered   11/07/18 0500  CBC  Tomorrow morning,   R     11/06/18 1516   11/07/18 9798  Basic metabolic panel  Tomorrow morning,   R  11/06/18 1516   11/07/18 0500  Magnesium  Tomorrow morning,   R     11/06/18 1516   11/07/18 0500  Phosphorus  Tomorrow morning,   R     11/06/18 1516   11/06/18 1618  Blood gas, arterial  ONCE - STAT,   R     11/06/18 1617   11/06/18 1516  HIV antibody (Routine Testing)  Once,   STAT     11/06/18 1516   11/06/18 1411  Triglycerides  (propofol (DIPRIVAN))  Every 72 hours,   R    Comments: While on propofol (DIPRIVAN)    11/06/18 1410          Vitals/Pain Today's Vitals   11/06/18 1655 11/06/18 1710 11/06/18 1715 11/06/18 1720  BP: 92/60 (!) 93/55 (!) 87/55 (!) 90/58  Pulse: 91 90 90 89  Resp: 16 16 16 16   Temp: (!) 97.3 F (36.3 C) (!) 97.4 F (36.3 C) (!) 97.4 F (36.3 C) (!) 97.4 F (36.3 C)  TempSrc:      SpO2: 100% 100% 100% 100%  Weight:      Height:        Isolation Precautions No active isolations  Medications Medications  0.9 %  sodium chloride infusion ( Intravenous Rate/Dose Change 11/06/18 1529)  fentaNYL (SUBLIMAZE) injection 100 mcg (has no  administration in time range)  fentaNYL (SUBLIMAZE) injection 100 mcg (has no administration in time range)  fentaNYL (SUBLIMAZE) bolus via infusion 50 mcg (has no administration in time range)  dexmedetomidine (PRECEDEX) 200 MCG/50ML (4 mcg/mL) infusion (0 mcg/kg/hr  68 kg Intravenous Hold 11/06/18 1728)  midazolam (VERSED) injection 1-2 mg (2 mg Intravenous Given 11/06/18 1557)  thiamine (VITAMIN B-1) tablet 100 mg (has no administration in time range)  folic acid (FOLVITE) tablet 1 mg (has no administration in time range)  multivitamin with minerals tablet 1 tablet (has no administration in time range)  heparin injection 5,000 Units (has no administration in time range)  pantoprazole (PROTONIX) injection 40 mg (has no administration in time range)  etomidate (AMIDATE) injection (20 mg Intravenous Given 11/06/18 1223)  rocuronium (ZEMURON) injection (80 mg Intravenous Given 11/06/18 1224)  fentaNYL (SUBLIMAZE) injection 50 mcg (50 mcg Intravenous Bolus 11/06/18 1504)  calcium gluconate 1 g/ 50 mL sodium chloride IVPB ( Intravenous Stopped 11/06/18 1628)    Mobility walks     Focused Assessments Pulmonary Assessment Handoff:  Lung sounds: Bilateral Breath Sounds: Clear O2 Device: Ventilator O2 Flow Rate (L/min): 6 L/min      R Recommendations: See Admitting Provider Note  Report given to:   Additional Notes:

## 2018-11-06 NOTE — ED Notes (Signed)
Suctioning pt, he now bites down on the yanker and moves upper body. EDP notified.

## 2018-11-06 NOTE — ED Notes (Signed)
Dr. Tamala Julian notified of patients current status and requirement of frequent bolus due to hypotension. He plans to come to bedside for assessment.

## 2018-11-06 NOTE — Procedures (Signed)
Extubation Procedure Note  Patient Details:   Name: Randall Garcia DOB: [birth year] MRN: [id number]   Airway Documentation:  Airway 7.5 mm (Active)  Secured at (cm) 25 cm 11/06/18 1509  Measured From Lips 11/06/18 1509  Secured Location Right 11/06/18 1509  Secured By [employer] 11/06/18 1509  Tube Holder Repositioned Yes 11/06/18 1235  Cuff Pressure (cm H2O) 28 cm H2O 11/06/18 1235  Site Condition Cool;Dry 11/06/18 1509   Vent end date: 11/06/18 Vent end time: 1832   Evaluation  O2 sats: stable throughout Complications: No apparent complications Patient did tolerate procedure well. Bilateral Breath Sounds: Clear   Yes   Patient extubated to room air upon arrival to ICU per physician order.  Pt tolerated procedure well.  Earney Navy 11/06/2018, 6:33 PM

## 2018-11-06 NOTE — ED Notes (Signed)
Returning from CT scan.

## 2018-11-06 NOTE — Code Documentation (Signed)
Nasal Trumpet in right nare

## 2018-11-06 NOTE — ED Notes (Signed)
Pt agitated, attempting to sit up in bed. Pharmacy has already been contacted for Precedex. IV versed used.

## 2018-11-06 NOTE — ED Notes (Signed)
ED Provider at bedside. 

## 2018-11-06 NOTE — ED Triage Notes (Signed)
Patient presents to the ED by EMS with unresponsive pt. LSW 1 hour ago, known hx of ETOH last drink 1 hr ago. Found by bystander (girlfriend). Pupils dilated 5/6. Initially agonal respirations being assisted by bag now on NRB. No trauma noted.

## 2018-11-06 NOTE — ED Notes (Addendum)
CCM at bedside they have stopped the Levo. New orders entered.  Pharmacy notified.

## 2018-11-06 NOTE — ED Notes (Signed)
Unable to update patient's next of kin, none listed in demographics. Will continue to follow for family contacts.

## 2018-11-06 NOTE — Code Documentation (Signed)
Bear Hugger in place

## 2018-11-06 NOTE — Code Documentation (Signed)
EDP at bedside. NRB d/c now on 4L Nasal Cannula.  Cbg to be recollected.

## 2018-11-06 NOTE — ED Notes (Signed)
Verbal Restraint order given by EDP.

## 2018-11-07 ENCOUNTER — Inpatient Hospital Stay (HOSPITAL_COMMUNITY): Payer: Self-pay

## 2018-11-07 DIAGNOSIS — R0603 Acute respiratory distress: Secondary | ICD-10-CM | POA: Diagnosis present

## 2018-11-07 LAB — BASIC METABOLIC PANEL
Anion gap: 12 (ref 5–15)
BUN: 6 mg/dL (ref 6–20)
CO2: 17 mmol/L — ABNORMAL LOW (ref 22–32)
Calcium: 7.7 mg/dL — ABNORMAL LOW (ref 8.9–10.3)
Chloride: 112 mmol/L — ABNORMAL HIGH (ref 98–111)
Creatinine, Ser: 0.84 mg/dL (ref 0.61–1.24)
GFR calc Af Amer: >60 mL/min (ref >60)
GFR calc non Af Amer: >60 mL/min (ref >60)
Glucose, Bld: 60 mg/dL — ABNORMAL LOW (ref 70–99)
Potassium: 3.4 mmol/L — ABNORMAL LOW (ref 3.5–5.1)
Sodium: 141 mmol/L (ref 135–145)

## 2018-11-07 LAB — MRSA PCR SCREENING: MRSA by PCR: NEGATIVE

## 2018-11-07 LAB — CBC
HCT: 37.9 % — ABNORMAL LOW (ref 39.0–52.0)
Hemoglobin: 12.6 g/dL — ABNORMAL LOW (ref 13.0–17.0)
MCH: 34.3 pg — ABNORMAL HIGH (ref 26.0–34.0)
MCHC: 33.2 g/dL (ref 30.0–36.0)
MCV: 103.3 fL — ABNORMAL HIGH (ref 80.0–100.0)
Platelets: UNDETERMINED 10*3/uL (ref 150–400)
RBC: 3.67 MIL/uL — ABNORMAL LOW (ref 4.22–5.81)
RDW: 14.1 % (ref 11.5–15.5)
WBC: 8.4 10*3/uL (ref 4.0–10.5)
nRBC: 0 % (ref 0.0–0.2)

## 2018-11-07 LAB — PHOSPHORUS: Phosphorus: 2.3 mg/dL — ABNORMAL LOW (ref 2.5–4.6)

## 2018-11-07 LAB — MAGNESIUM: Magnesium: 1.2 mg/dL — ABNORMAL LOW (ref 1.7–2.4)

## 2018-11-07 LAB — GLUCOSE, CAPILLARY
Glucose-Capillary: 47 mg/dL — ABNORMAL LOW (ref 70–99)
Glucose-Capillary: 90 mg/dL (ref 70–99)

## 2018-11-07 LAB — HIV ANTIBODY (ROUTINE TESTING W REFLEX): HIV Screen 4th Generation wRfx: NONREACTIVE

## 2018-11-07 MED ORDER — ADULT MULTIVITAMIN W/MINERALS CH: 1.0000 | ORAL_TABLET | Freq: Every day | ORAL | 1 refills | Status: DC

## 2018-11-07 MED ORDER — THIAMINE HCL 100 MG PO TABS: 100.0000 mg | ORAL_TABLET | Freq: Every day | ORAL | 1 refills | Status: DC

## 2018-11-07 MED ORDER — POTASSIUM PHOSPHATE MONOBASIC 500 MG PO TABS
500.0000 mg | ORAL_TABLET | Freq: Once | ORAL | Status: DC
  Filled 2018-11-07: qty 1

## 2018-11-07 MED ORDER — FUROSEMIDE 10 MG/ML IJ SOLN
40.0000 mg | Freq: Once | INTRAMUSCULAR | Status: AC
  Administered 2018-11-07: 11:00:00 40 mg via INTRAVENOUS
  Filled 2018-11-07: qty 4

## 2018-11-07 MED ORDER — MAGNESIUM SULFATE 4 GM/100ML IV SOLN
4.0000 g | Freq: Once | INTRAVENOUS | Status: AC
  Administered 2018-11-07: 4 g via INTRAVENOUS
  Filled 2018-11-07: qty 100

## 2018-11-07 MED ORDER — DEXTROSE 50 % IV SOLN
INTRAVENOUS | Status: AC
  Administered 2018-11-07: 07:00:00 12.5 g via INTRAVENOUS
  Filled 2018-11-07: qty 50

## 2018-11-07 MED ORDER — FOLIC ACID 1 MG PO TABS: 1.0000 mg | ORAL_TABLET | Freq: Every day | ORAL | 1 refills | Status: DC

## 2018-11-07 MED ORDER — POTASSIUM CHLORIDE CRYS ER 20 MEQ PO TBCR
40.0000 meq | EXTENDED_RELEASE_TABLET | Freq: Once | ORAL | Status: AC
  Administered 2018-11-07: 40 meq via ORAL
  Filled 2018-11-07: qty 2

## 2018-11-07 MED ORDER — DEXTROSE 50 % IV SOLN
12.5000 g | INTRAVENOUS | Status: AC
  Administered 2018-11-07: 12.5 g via INTRAVENOUS

## 2018-11-07 MED ORDER — MAGNESIUM SULFATE 2 GM/50ML IV SOLN: 2.0000 g | Freq: Once | INTRAVENOUS | Status: DC

## 2018-11-07 MED ORDER — MAGNESIUM SULFATE 4 GM/100ML IV SOLN: 4.0000 g | Freq: Once | INTRAVENOUS | Status: DC

## 2018-11-07 MED ORDER — CHLORHEXIDINE GLUCONATE CLOTH 2 % EX PADS
6.0000 | MEDICATED_PAD | Freq: Every day | CUTANEOUS | Status: DC
  Administered 2018-11-07: 6 via TOPICAL

## 2018-11-07 MED ORDER — POTASSIUM CHLORIDE 10 MEQ/100ML IV SOLN
10.0000 meq | INTRAVENOUS | Status: AC
  Administered 2018-11-07 (×2): 10 meq via INTRAVENOUS
  Filled 2018-11-07 (×2): qty 100

## 2018-11-07 NOTE — Progress Notes (Signed)
SLP Cancellation Note  Patient Details Name: Randall Garcia MRN: [id number] DOB: 09/14/1965   Cancelled treatment:       Reason Eval/Treat Not Completed: Other (comment) Per RN, pt is discharging home. Diet has already been initiated. Intubation was brief. Will hold swallow evaluation at this time but please reorder if it is still indicated prior to discharge.   [name] 11/07/2018, 11:12 AM  [name], M.A. [place] Acute Environmental education officer [phone] Office [phone]

## 2018-11-07 NOTE — Discharge Summary (Signed)
Physician Discharge Summary   Patient ID: Randall Garcia MRN: [id number] DOB/AGE: 1965-07-04 53 y.o.  Admit date: 11/06/2018 Discharge date: 11/07/2018                     Discharge Plan by Diagnosis   EtOH abuse - EtOH level 415 on admit with encephalopathy and inability to protect airway s/p intubation. - EtOH cessation counseling provided. - Continue thiamine / folate / multivitamin.  Incidental finding of irregular pulmonary nodules on CT. - Outpatient CT chest in 3 - 6 months (pt stated he would plan to establish with primary care provider).  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 - Outpatient follow up.  Acute pulmonary edema - s/p 40 mg lasix. - No further interventions required.  Hyomagnesemia - s/p repletion. Hypokalemia - s/p repletion. Hypophosphatemia - s/p repletion. - EtOH cessation counseling. - Push healthy nutrition.   Discharge Summary   Randall Garcia is a 53 y.o. y/o male with no significant PMH.  He presented to [facility] ED 6/16 after being found unresponsive.  He was last seen normal 1 hour prior when he was seen drinking some alcohol.  He was apparently found by his girlfriend who called EMS and upon their arrival, he was agonal.  He was brought to the ED where he was intubated for airway protection.  This was felt to be secondary to EtOH intoxication (EtOH level 415).  CT head and c-spine were negative for acute process though did show emphysema as well as incidental finding of irregular nodules in the apices.  Later that afternoon, was transferred to the ICU where he was successfully extubated.  Overnight, he remained stable.  On 6/17, he had some mild electrolyte abnormalities which were repleted.  He was deemed medically stable and was cleared for discharge home.  Of note, on admission, it was reported that he was homeless.  Upon further questioning, pt states he is in fact not homeless.  He lives with his brother here in [place].  He states that he has not seen a provider  in quite some time.  I informed him of incidental pulmonary nodule findings and that he should have repeat CT scan in a few months.  He states that he will plan to establish with a primary care provider.         Significant Hospital Events   6/16 > admit.  Extubated after arrival in ICU. 6/17 > discharged.  Significant Diagnostic Studies  CT head / c-spine 6/16 > negative for acute process though did show emphysema as well as incidental finding of irregular nodules in the apices.  Micro Data  SARS CoV2 6/16 > neg. Sputum 6/16 >   Antimicrobials  None.  Procedures (surgical and bedside):  ETT 6/16 > 6/16.  Objective:  Blood pressure 91/66, pulse 90, temperature 97.6 F (36.4 C), temperature source Oral, resp. rate 18, height 6' (1.829 m), weight 61.3 kg, SpO2 99 %.    Vent Mode: PRVC FiO2 (%):  [21 %-60 %] 21 % Set Rate:  [16 bmp] 16 bmp Vt Set:  [620 mL] 620 mL PEEP:  [5 cmH20] 5 cmH20 Plateau Pressure:  [9 cmH20-19 cmH20] 19 cmH20   Intake/Output Summary (Last 24 hours) at 11/07/2018 1053 Last data filed at 11/07/2018 0802 Gross per 24 hour  Intake 1047.71 ml  Output 1665 ml  Net -617.29 ml   Filed Weights   11/06/18 1457 11/06/18 2000 11/07/18 0500  Weight: 68 kg 61.5 kg 61.3 kg    Physical  Examination: General: Adult male who appears disheveled.  Resting comfortably in NAD. Neuro: A&O x 3, non-focal.   C-collar in place but now removed after C-spine was cleared. HEENT: [place]/AT. EOMI, sclerae anicteric. Cardiovascular: RRR, no M/R/G.  Lungs: Respirations even and unlabored.  CTA bilaterally, No W/R/R. Abdomen: BS x 4, soft, NT/ND.  Musculoskeletal: No gross deformities, no edema.  Skin: Poor hygiene, wounds to bilateral feet.  Otherwise intact, warm, no rashes.   Discharge Labs:  BMET Recent Labs  Lab 11/06/18 1150 11/06/18 1156 11/06/18 1625 11/07/18 0431  NA 139 140 144 141  K 5.0 3.7 3.5 3.4*  CL 107  --   --  112*  CO2 19*  --   --  17*  GLUCOSE 93   --   --  60*  BUN <5*  --   --  6  CREATININE 0.69  --   --  0.84  CALCIUM 8.8*  --   --  7.7*  MG  --   --   --  1.2*  PHOS  --   --   --  2.3*    CBC Recent Labs  Lab 11/06/18 1150 11/06/18 1156 11/06/18 1625 11/07/18 0431  HGB 16.3 17.0 14.6 12.6*  HCT 49.7 50.0 43.0 37.9*  WBC 7.3  --   --  8.4  PLT PLATELET CLUMPS NOTED ON SMEAR, COUNT APPEARS DECREASED  --   --  PLATELET CLUMPS NOTED ON SMEAR, UNABLE TO ESTIMATE    Anti-Coagulation No results for input(s): INR in the last 168 hours.  Discharge Instructions    Diet - low sodium heart healthy   Complete by: As directed    Increase activity slowly   Complete by: As directed           Allergies as of 11/07/2018   No Known Allergies     Medication List    TAKE these medications   folic acid 1 MG tablet Commonly known as: FOLVITE Place 1 tablet (1 mg total) into feeding tube daily.   multivitamin with minerals Tabs tablet Take 1 tablet by mouth daily.   thiamine 100 MG tablet Place 1 tablet (100 mg total) into feeding tube daily.        Disposition: Home.   Discharge Condition:  Randall Garcia has met maximum benefit of inpatient care and is medically stable and cleared for discharge.  Patient is pending follow up as above.     Time spent on discharge: Greater than 35 minutes.    Randall Hora, [place] Pager: [phone].  If no answer, [phone] 11/07/2018, 10:53 AM

## 2018-11-07 NOTE — Progress Notes (Signed)
EToh inotixcation Doing well Sleeping in bed    LABS    PULMONARY Recent Labs  Lab 11/06/18 1156 11/06/18 1625  PHART 7.331* 7.314*  PCO2ART 41.4 32.1  PO2ART 426.0* 159.0*  HCO3 23.0 16.4*  TCO2 24 17*  O2SAT 100.0 99.0    CBC Recent Labs  Lab 11/06/18 1150 11/06/18 1156 11/06/18 1625 11/07/18 0431  HGB 16.3 17.0 14.6 12.6*  HCT 49.7 50.0 43.0 37.9*  WBC 7.3  --   --  8.4  PLT PLATELET CLUMPS NOTED ON SMEAR, COUNT APPEARS DECREASED  --   --  PLATELET CLUMPS NOTED ON SMEAR, UNABLE TO ESTIMATE    COAGULATION No results for input(s): INR in the last 168 hours.  CARDIAC  No results for input(s): TROPONINI in the last 168 hours. No results for input(s): PROBNP in the last 168 hours.   CHEMISTRY Recent Labs  Lab 11/06/18 1150 11/06/18 1156 11/06/18 1625 11/07/18 0431  NA 139 140 144 141  K 5.0 3.7 3.5 3.4*  CL 107  --   --  112*  CO2 19*  --   --  17*  GLUCOSE 93  --   --  60*  BUN <5*  --   --  6  CREATININE 0.69  --   --  0.84  CALCIUM 8.8*  --   --  7.7*  MG  --   --   --  1.2*  PHOS  --   --   --  2.3*   Estimated Creatinine Clearance: 88.2 mL/min (by C-G formula based on SCr of 0.84 mg/dL).   LIVER Recent Labs  Lab 11/06/18 1150  AST 117*  ALT 50*  ALKPHOS 86  BILITOT 0.8  PROT 7.7  ALBUMIN 3.8     INFECTIOUS No results for input(s): LATICACIDVEN, PROCALCITON in the last 168 hours.   ENDOCRINE CBG (last 3)  Recent Labs    11/06/18 1220 11/07/18 0635 11/07/18 0703  GLUCAP 112* 47* 90         IMAGING x48h  - image(s) personally visualized  -   highlighted in bold Ct Head Wo Contrast  Result Date: 11/06/2018 CLINICAL DATA:  Found down EXAM: CT HEAD WITHOUT CONTRAST CT CERVICAL SPINE WITHOUT CONTRAST TECHNIQUE: Multidetector CT imaging of the head and cervical spine was performed following the standard protocol without intravenous contrast. Multiplanar CT image reconstructions of the cervical spine were also generated.  COMPARISON:  None. FINDINGS: CT HEAD FINDINGS Brain: No evidence of acute infarction, hemorrhage, hydrocephalus, extra-axial collection or mass lesion/mass effect. Vascular: No hyperdense vessel or unexpected calcification. Skull: Normal. Negative for fracture or focal lesion. Sinuses/Orbits: No acute finding. Other: None. CT CERVICAL SPINE FINDINGS Alignment: Normal. Skull base and vertebrae: No acute fracture. No primary bone lesion or focal pathologic process. Soft tissues and spinal canal: No prevertebral fluid or swelling. No visible canal hematoma. Disc levels:  Intact. Upper chest: Emphysema. Irregular nodules of the included bilateral lung apices, the largest in the left apex measuring at least 1.5 cm. Other: None. IMPRESSION: 1.  No acute intracranial pathology. 2.  No fracture or static subluxation of the cervical spine. 3. Emphysema. Irregular nodules of the included bilateral lung apices, the largest in the left apex measuring at least 1.5 cm. Recommend dedicated CT examination of the chest on a nonemergent basis when clinically appropriate. Electronically Signed   By: Lauralyn PrimesAlex  Bibbey M.D.   On: 11/06/2018 13:59   Ct Cervical Spine Wo Contrast  Result Date: 11/06/2018 CLINICAL DATA:  Found down EXAM: CT HEAD WITHOUT CONTRAST CT CERVICAL SPINE WITHOUT CONTRAST TECHNIQUE: Multidetector CT imaging of the head and cervical spine was performed following the standard protocol without intravenous contrast. Multiplanar CT image reconstructions of the cervical spine were also generated. COMPARISON:  None. FINDINGS: CT HEAD FINDINGS Brain: No evidence of acute infarction, hemorrhage, hydrocephalus, extra-axial collection or mass lesion/mass effect. Vascular: No hyperdense vessel or unexpected calcification. Skull: Normal. Negative for fracture or focal lesion. Sinuses/Orbits: No acute finding. Other: None. CT CERVICAL SPINE FINDINGS Alignment: Normal. Skull base and vertebrae: No acute fracture. No primary bone  lesion or focal pathologic process. Soft tissues and spinal canal: No prevertebral fluid or swelling. No visible canal hematoma. Disc levels:  Intact. Upper chest: Emphysema. Irregular nodules of the included bilateral lung apices, the largest in the left apex measuring at least 1.5 cm. Other: None. IMPRESSION: 1.  No acute intracranial pathology. 2.  No fracture or static subluxation of the cervical spine. 3. Emphysema. Irregular nodules of the included bilateral lung apices, the largest in the left apex measuring at least 1.5 cm. Recommend dedicated CT examination of the chest on a nonemergent basis when clinically appropriate. Electronically Signed   By: Eddie Candle M.D.   On: 11/06/2018 13:59   Dg Chest Port 1 View  Result Date: 11/07/2018 CLINICAL DATA:  Respiratory failure.  Patient was found unconscious. EXAM: PORTABLE CHEST 1 VIEW COMPARISON:  One-view chest x-ray 11/06/2018 FINDINGS: The heart size is normal. The patient has been extubated. Asymmetric right lower lobe airspace disease is now present. Mild pulmonary vascular congestion is present. No definite effusions are seen. Atherosclerotic changes are noted at the aortic arch. Remote bilateral rib fractures are present. IMPRESSION: 1. Progressive right infrahilar airspace disease concerning for infection. 2. Mild pulmonary vascular congestion is present. 3. Interval extubation. 4. Aortic atherosclerosis. Electronically Signed   By: San Morelle M.D.   On: 11/07/2018 07:41   Dg Chest Port 1 View  Result Date: 11/06/2018 CLINICAL DATA:  Cough, respiratory distress EXAM: PORTABLE CHEST 1 VIEW COMPARISON:  None. FINDINGS: Endotracheal tube with the tip 3.6 cm above the carina. No focal consolidation, pleural effusion or pneumothorax. Stable cardiomediastinal silhouette. Nasogastric tube projecting over the stomach. Bilateral nondisplaced healed posterior rib fractures. IMPRESSION: 1. Endotracheal tube with the tip 3.6 cm above the carina.  Electronically Signed   By: Kathreen Devoid   On: 11/06/2018 13:11   A) ETOH intoxication - resolved Low Mag Low phos  P Replete mag IV Replete phos po Then can go home     SIGNATURE    Dr. Brand Males, M.D., F.C.C.P,  Pulmonary and Critical Care Medicine Staff Physician, [place] Director - Interstitial Lung Disease  Program  Pulmonary [facility] [place], [place], [address]  Pager: [phone], If no answer or between  15:00h - 7:00h: call [phone] Telephone: [phone]  11:05 AM 11/07/2018

## 2018-11-07 NOTE — Progress Notes (Signed)
[facility] ADULT ICU REPLACEMENT PROTOCOL FOR AM LAB REPLACEMENT ONLY  The patient does apply for the [facility] Adult ICU Electrolyte Replacment Protocol based on the criteria listed below:   1. Is GFR >/= 40 ml/min? Yes.    Patient's GFR today is >40 2. Is urine output >/= 0.5 ml/kg/hr for the last 6 hours? Yes.   Patient's UOP is 1.02  ml/kg/hr 3. Is BUN < 60 mg/dL? Yes.    Patient's BUN today is 6 4. Abnormal electrolyte(s): Potassium 3.4 5. Ordered repletion with: Replaced per protocol. 6. If a panic level lab has been reported, has the CCM MD in charge been notified? .   Physician:    Adam Phenix 11/07/2018 6:32 AM

## 2018-11-07 NOTE — Progress Notes (Signed)
Pt restless in bed when assessment completed, PRN RX administered - see MAR for intervention. Pt A&O x 1, to self. Attempt to orient pt to room and unit unsuccessful, pt unable to indicate understanding. Pt extubated, NGT removed, and restraints DC'ed prior to assumption of care. Elink updated on pt condition, permission rec'd to defer [facility] neuro checks and foley catheter removal pending evaluation of the  effectiveness of the precedex gtt.

## 2018-11-07 NOTE — Progress Notes (Signed)
OT Cancellation Note  Patient Details Name: Randall Garcia MRN: [id number] DOB: Oct 26, 1965   Cancelled Treatment:    Reason Eval/Treat Not Completed: OT screened, no needs identified, will sign off; per RN and PT pt at baseline functional status for ADLs/mobility and plans to dc home today.  OT signing off. If further needs arise, please re-consult.   Delight Stare, OT Acute Rehabilitation Services Pager [phone] Office [phone]   Delight Stare 11/07/2018, 12:18 PM
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
## 2018-11-07 NOTE — Progress Notes (Signed)
Pt eyes open to verbal stimulation, but pt speech remains garbled, and he is unable to stay focused- attempt to complete assessment unsuccessful at this time.

## 2018-11-07 NOTE — Clinical Social Work Note (Signed)
Patient medically stable for discharge and transport home requested. CSW provided nurse with cab voucher for patient to be transported home - [place] signing off as no other SW intervention services needed.  Ardine Iacovelli Givens, MSW, LCSW Licensed Clinical Social Worker [place] [phone]

## 2019-02-17 ENCOUNTER — Emergency Department (HOSPITAL_COMMUNITY): Payer: Self-pay

## 2019-02-17 ENCOUNTER — Inpatient Hospital Stay (HOSPITAL_COMMUNITY)
Admission: EM | Admit: 2019-02-17 | Discharge: 2019-02-19 | DRG: 872 | Discharge status: Against Medical Advice | Payer: Self-pay | Attending: Family Medicine | Admitting: Family Medicine

## 2019-02-17 ENCOUNTER — Encounter (HOSPITAL_COMMUNITY): Payer: Self-pay | Admitting: Emergency Medicine

## 2019-02-17 DIAGNOSIS — X58XXXA Exposure to other specified factors, initial encounter: Secondary | ICD-10-CM | POA: Diagnosis present

## 2019-02-17 DIAGNOSIS — E861 Hypovolemia: Secondary | ICD-10-CM | POA: Diagnosis present

## 2019-02-17 DIAGNOSIS — E871 Hypo-osmolality and hyponatremia: Secondary | ICD-10-CM | POA: Diagnosis present

## 2019-02-17 DIAGNOSIS — Z20828 Contact with and (suspected) exposure to other viral communicable diseases: Secondary | ICD-10-CM | POA: Diagnosis present

## 2019-02-17 DIAGNOSIS — S60552A Superficial foreign body of left hand, initial encounter: Secondary | ICD-10-CM | POA: Diagnosis present

## 2019-02-17 DIAGNOSIS — M60832 Other myositis, left forearm: Secondary | ICD-10-CM | POA: Diagnosis present

## 2019-02-17 DIAGNOSIS — Z5329 Procedure and treatment not carried out because of patient's decision for other reasons: Secondary | ICD-10-CM | POA: Diagnosis not present

## 2019-02-17 DIAGNOSIS — A419 Sepsis, unspecified organism: Principal | ICD-10-CM | POA: Diagnosis present

## 2019-02-17 DIAGNOSIS — L03114 Cellulitis of left upper limb: Secondary | ICD-10-CM | POA: Diagnosis present

## 2019-02-17 DIAGNOSIS — W19XXXA Unspecified fall, initial encounter: Secondary | ICD-10-CM | POA: Diagnosis present

## 2019-02-17 DIAGNOSIS — F1010 Alcohol abuse, uncomplicated: Secondary | ICD-10-CM | POA: Diagnosis present

## 2019-02-17 DIAGNOSIS — S50812A Abrasion of left forearm, initial encounter: Secondary | ICD-10-CM | POA: Diagnosis present

## 2019-02-17 DIAGNOSIS — E876 Hypokalemia: Secondary | ICD-10-CM | POA: Diagnosis present

## 2019-02-17 DIAGNOSIS — F17200 Nicotine dependence, unspecified, uncomplicated: Secondary | ICD-10-CM | POA: Diagnosis present

## 2019-02-17 HISTORY — DX: Other specified health status: Z78.9

## 2019-02-17 LAB — COMPREHENSIVE METABOLIC PANEL
ALT: 25 U/L (ref 0–44)
AST: 26 U/L (ref 15–41)
Albumin: 3.3 g/dL — ABNORMAL LOW (ref 3.5–5.0)
Alkaline Phosphatase: 64 U/L (ref 38–126)
Anion gap: 15 (ref 5–15)
BUN: 6 mg/dL (ref 6–20)
CO2: 22 mmol/L (ref 22–32)
Calcium: 9.6 mg/dL (ref 8.9–10.3)
Chloride: 93 mmol/L — ABNORMAL LOW (ref 98–111)
Creatinine, Ser: 0.78 mg/dL (ref 0.61–1.24)
GFR calc Af Amer: >60 mL/min (ref >60)
GFR calc non Af Amer: >60 mL/min (ref >60)
Glucose, Bld: 103 mg/dL — ABNORMAL HIGH (ref 70–99)
Potassium: 3.6 mmol/L (ref 3.5–5.1)
Sodium: 130 mmol/L — ABNORMAL LOW (ref 135–145)
Total Bilirubin: 1.5 mg/dL — ABNORMAL HIGH (ref 0.3–1.2)
Total Protein: 8.3 g/dL — ABNORMAL HIGH (ref 6.5–8.1)

## 2019-02-17 LAB — CBC WITH DIFFERENTIAL/PLATELET
Abs Immature Granulocytes: 0.11 10*3/uL — ABNORMAL HIGH (ref 0.00–0.07)
Basophils Absolute: 0.1 10*3/uL (ref 0.0–0.1)
Basophils Relative: 0 %
Eosinophils Absolute: 0.1 10*3/uL (ref 0.0–0.5)
Eosinophils Relative: 1 %
HCT: 40.6 % (ref 39.0–52.0)
Hemoglobin: 14.3 g/dL (ref 13.0–17.0)
Immature Granulocytes: 1 %
Lymphocytes Relative: 6 %
Lymphs Abs: 0.7 10*3/uL (ref 0.7–4.0)
MCH: 36.2 pg — ABNORMAL HIGH (ref 26.0–34.0)
MCHC: 35.2 g/dL (ref 30.0–36.0)
MCV: 102.8 fL — ABNORMAL HIGH (ref 80.0–100.0)
Monocytes Absolute: 1 10*3/uL (ref 0.1–1.0)
Monocytes Relative: 8 %
Neutro Abs: 10 10*3/uL — ABNORMAL HIGH (ref 1.7–7.7)
Neutrophils Relative %: 84 %
Platelets: 140 10*3/uL — ABNORMAL LOW (ref 150–400)
RBC: 3.95 MIL/uL — ABNORMAL LOW (ref 4.22–5.81)
RDW: 12.7 % (ref 11.5–15.5)
WBC: 12 10*3/uL — ABNORMAL HIGH (ref 4.0–10.5)
nRBC: 0 % (ref 0.0–0.2)

## 2019-02-17 LAB — URINALYSIS, ROUTINE W REFLEX MICROSCOPIC
Bacteria, UA: NONE SEEN
Bilirubin Urine: NEGATIVE
Glucose, UA: NEGATIVE mg/dL
Hgb urine dipstick: NEGATIVE
Ketones, ur: 5 mg/dL — AB
Leukocytes,Ua: NEGATIVE
Nitrite: NEGATIVE
Protein, ur: NEGATIVE mg/dL
Specific Gravity, Urine: 1.019 (ref 1.005–1.030)
pH: 5 (ref 5.0–8.0)

## 2019-02-17 LAB — SARS CORONAVIRUS 2 BY RT PCR (HOSPITAL ORDER, PERFORMED IN ~~LOC~~ HOSPITAL LAB): SARS Coronavirus 2: NEGATIVE

## 2019-02-17 LAB — LACTIC ACID, PLASMA
Lactic Acid, Venous: 1.6 mmol/L (ref 0.5–1.9)
Lactic Acid, Venous: 1.8 mmol/L (ref 0.5–1.9)

## 2019-02-17 MED ORDER — PIPERACILLIN-TAZOBACTAM 3.375 G IVPB 30 MIN
3.3750 g | Freq: Once | INTRAVENOUS | Status: AC
  Administered 2019-02-17: 3.375 g via INTRAVENOUS
  Filled 2019-02-17: qty 50

## 2019-02-17 MED ORDER — ADULT MULTIVITAMIN W/MINERALS CH
1.0000 | ORAL_TABLET | Freq: Every day | ORAL | Status: DC
  Administered 2019-02-18: 1 via ORAL
  Filled 2019-02-17 (×2): qty 1

## 2019-02-17 MED ORDER — LORAZEPAM 1 MG PO TABS: 0.0000 mg | ORAL_TABLET | Freq: Two times a day (BID) | ORAL | Status: DC

## 2019-02-17 MED ORDER — IOHEXOL 300 MG/ML  SOLN
75.0000 mL | Freq: Once | INTRAMUSCULAR | Status: AC | PRN
  Administered 2019-02-17: 19:00:00 75 mL via INTRAVENOUS

## 2019-02-17 MED ORDER — VITAMIN B-1 100 MG PO TABS
100.0000 mg | ORAL_TABLET | Freq: Every day | ORAL | Status: DC
  Filled 2019-02-17 (×2): qty 1

## 2019-02-17 MED ORDER — LORAZEPAM 2 MG/ML IJ SOLN: 1.0000 mg | INTRAMUSCULAR | Status: DC | PRN

## 2019-02-17 MED ORDER — ACETAMINOPHEN 325 MG PO TABS: 650.0000 mg | ORAL_TABLET | Freq: Four times a day (QID) | ORAL | Status: DC | PRN

## 2019-02-17 MED ORDER — LORAZEPAM 1 MG PO TABS
0.0000 mg | ORAL_TABLET | Freq: Four times a day (QID) | ORAL | Status: DC
  Administered 2019-02-18: 1 mg via ORAL
  Filled 2019-02-17: qty 1

## 2019-02-17 MED ORDER — ONDANSETRON HCL 4 MG/2ML IJ SOLN: 4.0000 mg | Freq: Four times a day (QID) | INTRAMUSCULAR | Status: DC | PRN

## 2019-02-17 MED ORDER — TETANUS-DIPHTH-ACELL PERTUSSIS 5-2.5-18.5 LF-MCG/0.5 IM SUSP
0.5000 mL | Freq: Once | INTRAMUSCULAR | Status: AC
  Administered 2019-02-17: 0.5 mL via INTRAMUSCULAR
  Filled 2019-02-17: qty 0.5

## 2019-02-17 MED ORDER — SODIUM CHLORIDE 0.9 % IV SOLN
INTRAVENOUS | Status: AC
  Administered 2019-02-17: 23:00:00 via INTRAVENOUS

## 2019-02-17 MED ORDER — SODIUM CHLORIDE 0.9 % IV SOLN
1000.0000 mL | INTRAVENOUS | Status: DC
  Administered 2019-02-17: 1000 mL via INTRAVENOUS

## 2019-02-17 MED ORDER — ONDANSETRON HCL 4 MG PO TABS: 4.0000 mg | ORAL_TABLET | Freq: Four times a day (QID) | ORAL | Status: DC | PRN

## 2019-02-17 MED ORDER — VANCOMYCIN HCL 10 G IV SOLR
1250.0000 mg | Freq: Two times a day (BID) | INTRAVENOUS | Status: DC
  Administered 2019-02-18 (×2): 1250 mg via INTRAVENOUS
  Filled 2019-02-17 (×4): qty 1250

## 2019-02-17 MED ORDER — FOLIC ACID 1 MG PO TABS
1.0000 mg | ORAL_TABLET | Freq: Every day | ORAL | Status: DC
  Administered 2019-02-18: 1 mg via ORAL
  Filled 2019-02-17 (×2): qty 1

## 2019-02-17 MED ORDER — VANCOMYCIN HCL 10 G IV SOLR
1500.0000 mg | Freq: Once | INTRAVENOUS | Status: AC
  Administered 2019-02-17: 19:00:00 1500 mg via INTRAVENOUS
  Filled 2019-02-17: qty 1500

## 2019-02-17 MED ORDER — HYDROCODONE-ACETAMINOPHEN 5-325 MG PO TABS
1.0000 | ORAL_TABLET | ORAL | Status: DC | PRN
  Administered 2019-02-18 (×2): 2 via ORAL
  Filled 2019-02-17 (×2): qty 2

## 2019-02-17 MED ORDER — VANCOMYCIN HCL IN DEXTROSE 1-5 GM/200ML-% IV SOLN: 1000.0000 mg | Freq: Once | INTRAVENOUS | Status: DC

## 2019-02-17 MED ORDER — THIAMINE HCL 100 MG/ML IJ SOLN
100.0000 mg | Freq: Every day | INTRAMUSCULAR | Status: DC
  Administered 2019-02-18: 100 mg via INTRAVENOUS
  Filled 2019-02-17: qty 2

## 2019-02-17 MED ORDER — ACETAMINOPHEN 650 MG RE SUPP: 650.0000 mg | Freq: Four times a day (QID) | RECTAL | Status: DC | PRN

## 2019-02-17 MED ORDER — PIPERACILLIN-TAZOBACTAM 3.375 G IVPB
3.3750 g | Freq: Three times a day (TID) | INTRAVENOUS | Status: DC
  Administered 2019-02-18 – 2019-02-19 (×5): 3.375 g via INTRAVENOUS
  Filled 2019-02-17 (×5): qty 50

## 2019-02-17 MED ORDER — ACETAMINOPHEN 325 MG PO TABS: 325.0000 mg | ORAL_TABLET | Freq: Once | ORAL | Status: DC

## 2019-02-17 MED ORDER — LORAZEPAM 1 MG PO TABS: 1.0000 mg | ORAL_TABLET | ORAL | Status: DC | PRN
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
## 2019-02-17 NOTE — ED Notes (Signed)
Patient transported to CT 

## 2019-02-17 NOTE — ED Triage Notes (Signed)
Pt here from home with a wound to the left arm , ems had to cut  Clothes from arm , arm is red and swollen and draining
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
## 2019-02-17 NOTE — Progress Notes (Signed)
Pharmacy Antibiotic Note  Randall Garcia is a 53 y.o. male admitted on 02/17/2019 with cellulitis.  Pharmacy has been consulted for Vancomycin/Zosyn dosing. WBC mildly elevated. Renal function good.   Plan: Vancomycin 1250 mg IV q12h >>Estimated AUC: 502 Zosyn 3.375G IV q8h to be infused over 4 hours Trend WBC, temp, renal function  F/U infectious work-up Drug levels as indicated   Height: 5\' 11"  (180.3 cm) Weight: 155 lb (70.3 kg) IBW/kg (Calculated) : 75.3  Temp (24hrs), Avg:98.4 F (36.9 C), Min:98.4 F (36.9 C), Max:98.4 F (36.9 C)  Recent Labs  Lab 02/17/19 1607 02/17/19 1747  WBC 12.0*  --   CREATININE 0.78  --   LATICACIDVEN 1.6 1.8    Estimated Creatinine Clearance: 106.2 mL/min (by C-G formula based on SCr of 0.78 mg/dL).    No Known Allergies  Narda Bonds, PharmD, BCPS Clinical Pharmacist Phone: [phone]

## 2019-02-17 NOTE — ED Provider Notes (Signed)
[facility] EMERGENCY DEPARTMENT Provider Note   CSN: [id number] Arrival date & time: 02/17/19  1547     History   Chief Complaint Chief Complaint  Patient presents with  . Wound Check    HPI Randall Garcia is a 53 y.o. male with no significant past medical history who presents to the ED on 9/27 due to severe left forearm and hand pain which began after rolling down numerous concrete stairs on Tuesday. Pain is associated with redness and swelling. Patient admits that the pain and swelling has progressively gotten worse over the past few days. He has been cleaning the injury periodically with no relief. Patient denies fever, but admits to chills. He does not have a history diabetes. No IV drug usage. Patient denies chest pain and shortness of breath.   Past Medical History:  Diagnosis Date  . Medical history non-contributory   . Seasonal allergies     Patient Active Problem List   Diagnosis Date Noted  . Left arm cellulitis 02/17/2019  . Hyponatremia 02/17/2019  . Alcohol abuse 02/17/2019    History reviewed. No pertinent surgical history.      Home Medications    Prior to Admission medications   Not on File    Family History History reviewed. No pertinent family history.  Social History Social History   Tobacco Use  . Smoking status: Current Every Day Smoker  . Smokeless tobacco: Never Used  Substance Use Topics  . Alcohol use: Yes  . Drug use: Yes    Types: Marijuana     Allergies   Patient has no known allergies.   Review of Systems Review of Systems  Constitutional: Positive for chills. Negative for fever.  Eyes: Negative for visual disturbance.  Respiratory: Negative for shortness of breath.   Cardiovascular: Negative for chest pain and palpitations.  Gastrointestinal: Negative for abdominal pain.  Genitourinary: Negative for dysuria.  Musculoskeletal: Positive for arthralgias, joint swelling and myalgias.  Skin: Positive  for color change and wound. Negative for rash.  Neurological: Negative for dizziness and light-headedness.  All other systems reviewed and are negative.    Physical Exam Updated Vital Signs BP 101/67   Pulse 90   Temp 98.4 F (36.9 C) (Oral)   Resp (!) 23   Ht  (1.803 m)   Wt 70.3 kg   SpO2 98%   BMI 21.62 kg/m   Physical Exam Constitutional:      General: He is not in acute distress.    Comments: Shivering on exam  HENT:     Head: Normocephalic.  Eyes:     Pupils: Pupils are equal, round, and reactive to light.  Neck:     Musculoskeletal: Neck supple.  Cardiovascular:     Rate and Rhythm: Regular rhythm. Tachycardia present.     Pulses: Normal pulses.     Heart sounds: Normal heart sounds. No murmur. No friction rub. No gallop.   Pulmonary:     Effort: Pulmonary effort is normal.     Breath sounds: Normal breath sounds.  Abdominal:     General: Abdomen is flat. Bowel sounds are normal. There is no distension.     Palpations: Abdomen is soft.  Musculoskeletal:     Comments: Left: Decreased ROM of left wrist and left fingers. Normal ROM of left elbow. Distal pulses intact. Sensation intact   Skin:    Findings: Erythema and lesion present.     Comments: Left arm: Diffuse erythema spreading from proximal  forearm all the way down to hand with significant induration of the left dorsal aspect of the hand. Large abrasion located on dorsal aspect of forearm.  Neurological:     General: No focal deficit present.      ED Treatments / Results  Labs (all labs ordered are listed, but only abnormal results are displayed) Labs Reviewed  COMPREHENSIVE METABOLIC PANEL - Abnormal; Notable for the following components:      Result Value   Sodium 130 (*)    Chloride 93 (*)    Glucose, Bld 103 (*)    Total Protein 8.3 (*)    Albumin 3.3 (*)    Total Bilirubin 1.5 (*)    All other components within normal limits  CBC WITH DIFFERENTIAL/PLATELET - Abnormal; Notable for  the following components:   WBC 12.0 (*)    RBC 3.95 (*)    MCV 102.8 (*)    MCH 36.2 (*)    Platelets 140 (*)    Neutro Abs 10.0 (*)    Abs Immature Granulocytes 0.11 (*)    All other components within normal limits  URINALYSIS, ROUTINE W REFLEX MICROSCOPIC - Abnormal; Notable for the following components:   Ketones, ur 5 (*)    All other components within normal limits  SARS CORONAVIRUS 2 (HOSPITAL ORDER, PERFORMED IN [facility])  CULTURE, BLOOD (ROUTINE X 2)  CULTURE, BLOOD (ROUTINE X 2)  LACTIC ACID, PLASMA  LACTIC ACID, PLASMA  COMPREHENSIVE METABOLIC PANEL  MAGNESIUM  PHOSPHORUS    EKG EKG Interpretation  Date/Time:  Sunday February 17 2019 20:00:21 EDT Ventricular Rate:  94 PR Interval:    QRS Duration: 98 QT Interval:  380 QTC Calculation: 476 R Axis:   78 Text Interpretation:  Sinus rhythm Borderline prolonged QT interval Artifact in lead(s) I II III V1 V2 V3 V4 V5 V6 No old tracing to compare Confirmed by Eber Hong ([id number]) on 02/17/2019 9:26:14 PM   Radiology Dg Forearm Left  Result Date: 02/17/2019 CLINICAL DATA:  Larey Seat.  Right forearm pain. EXAM: LEFT FOREARM - 2 VIEW COMPARISON:  None. FINDINGS: The elbow and wrist joints are maintained. No acute forearm fracture is identified. IMPRESSION: No acute bony findings. Electronically Signed   By: Rudie Meyer M.D.   On: 02/17/2019 16:24   Ct Forearm Left W Contrast  Result Date: 02/17/2019 CLINICAL DATA:  Wrist pain infection swelling EXAM: CT OF THE UPPER LEFT EXTREMITY (Hand, Wrist, Forearm) WITH CONTRAST TECHNIQUE: Multidetector CT imaging of the upper left extremity was performed according to the standard protocol following intravenous contrast administration. CONTRAST:  46mL OMNIPAQUE IOHEXOL 300 MG/ML  SOLN COMPARISON:  Radiograph 02/17/2019 FINDINGS: Bones/Joint/Cartilage No acute fracture or malalignment. No periostitis or bone destruction. Mild dorsal cortical thickening and remodeling of  the distal radius possibly due to remote trauma. No large elbow effusion. Degenerative changes at the first MCP joint without erosion or bony destructive change. Degenerative narrowing at the second and third MCP joints. Metallic screw within the third distal phalanx. Ligaments Suboptimally assessed by CT. Muscles and Tendons Slightly indistinct appearance of the mid to distal forearm muscles, volar compartment. Soft tissues Considerable subcutaneous edema at the hand and wrist. Moderate subcutaneous edema at the forearm. No discrete focal fluid collections or rim enhancing fluid collections. Diffuse skin thickening. Metallic foreign body at the palm of the hand IMPRESSION: 1. Considerable soft tissue edema at the hand and wrist, greatest along the dorsal aspect, with additional moderate soft tissue edema throughout the forearm,  compatible with cellulitis. Negative for soft tissue emphysema. Negative for organized fluid collection/soft tissue abscess. Indistinct appearance of the mid to distal forearm muscles, possible myositis. 2. No acute osseous abnormality. 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 3. Metallic foreign body within the superficial palm of the hand. Postsurgical changes of the third distal phalanx 4. If continued clinical suspicion for osteomyelitis or joint space infection, further evaluation with MRI should be considered. Electronically Signed   By: Jasmine Pang M.D.   On: 02/17/2019 20:32   Ct Wrist Left W Contrast  Result Date: 02/17/2019 CLINICAL DATA:  Wrist pain infection swelling EXAM: CT OF THE UPPER LEFT EXTREMITY (Hand, Wrist, Forearm) WITH CONTRAST TECHNIQUE: Multidetector CT imaging of the upper left extremity was performed according to the standard protocol following intravenous contrast administration. CONTRAST:  75mL OMNIPAQUE IOHEXOL 300 MG/ML  SOLN COMPARISON:  Radiograph 02/17/2019 FINDINGS: Bones/Joint/Cartilage No acute fracture or  malalignment. No periostitis or bone destruction. Mild dorsal cortical thickening and remodeling of the distal radius possibly due to remote trauma. No large elbow effusion. Degenerative changes at the first MCP joint without erosion or bony destructive change. Degenerative narrowing at the second and third MCP joints. Metallic screw within the third distal phalanx. Ligaments Suboptimally assessed by CT. Muscles and Tendons Slightly indistinct appearance of the mid to distal forearm muscles, volar compartment. Soft tissues Considerable subcutaneous edema at the hand and wrist. Moderate subcutaneous edema at the forearm. No discrete focal fluid collections or rim enhancing fluid collections. Diffuse skin thickening. Metallic foreign body at the palm of the hand IMPRESSION: 1. Considerable soft tissue edema at the hand and wrist, greatest along the dorsal aspect, with additional moderate soft tissue edema throughout the forearm, compatible with cellulitis. Negative for soft tissue emphysema. Negative for organized fluid collection/soft tissue abscess. Indistinct appearance of the mid to distal forearm muscles, possible myositis. 2. No acute osseous abnormality. 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 3. Metallic foreign body within the superficial palm of the hand. Postsurgical changes of the third distal phalanx 4. If continued clinical suspicion for osteomyelitis or joint space infection, further evaluation with MRI should be considered. Electronically Signed   By: Jasmine Pang M.D.   On: 02/17/2019 20:32   Ct Hand Left W Contrast  Result Date: 02/17/2019 CLINICAL DATA:  Wrist pain infection swelling EXAM: CT OF THE UPPER LEFT EXTREMITY (Hand, Wrist, Forearm) WITH CONTRAST TECHNIQUE: Multidetector CT imaging of the upper left extremity was performed according to the standard protocol following intravenous contrast administration. CONTRAST:  75mL OMNIPAQUE IOHEXOL 300 MG/ML   SOLN COMPARISON:  Radiograph 02/17/2019 FINDINGS: Bones/Joint/Cartilage No acute fracture or malalignment. No periostitis or bone destruction. Mild dorsal cortical thickening and remodeling of the distal radius possibly due to remote trauma. No large elbow effusion. Degenerative changes at the first MCP joint without erosion or bony destructive change. Degenerative narrowing at the second and third MCP joints. Metallic screw within the third distal phalanx. Ligaments Suboptimally assessed by CT. Muscles and Tendons Slightly indistinct appearance of the mid to distal forearm muscles, volar compartment. Soft tissues Considerable subcutaneous edema at the hand and wrist. Moderate subcutaneous edema at the forearm. No discrete focal fluid collections or rim enhancing fluid collections. Diffuse skin thickening. Metallic foreign body at the palm of the hand IMPRESSION: 1. Considerable soft tissue edema at the hand and wrist, greatest along the dorsal aspect, with additional moderate soft tissue edema throughout the forearm, compatible with  cellulitis. Negative for soft tissue emphysema. Negative for organized fluid collection/soft tissue abscess. Indistinct appearance of the mid to distal forearm muscles, possible myositis. 2. No acute osseous abnormality. 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 3. Metallic foreign body within the superficial palm of the hand. Postsurgical changes of the third distal phalanx 4. If continued clinical suspicion for osteomyelitis or joint space infection, further evaluation with MRI should be considered. Electronically Signed   By: Donavan Foil M.D.   On: 02/17/2019 20:32   Dg Hand Complete Left  Result Date: 02/17/2019 CLINICAL DATA:  Pain and swelling. EXAM: LEFT HAND - COMPLETE 3+ VIEW COMPARISON:  None FINDINGS: There is severe soft tissue swelling in the left hand. A rounded density projects over the palm of the hand. It is unclear whether this is  something on the patient or an age indeterminate foreign body. No fractures are identified. IMPRESSION: 1. Severe soft tissue swelling. 2. There is a rounded density along the palmar surface of the hand which could be something on the patient or represent an age indeterminate foreign body. Recommend clinical correlation. 3. No other abnormalities. Electronically Signed   By: Dorise Bullion III M.D   On: 02/17/2019 16:22    Procedures Procedures (including critical care time)  Medications Ordered in ED Medications  acetaminophen (TYLENOL) tablet 325 mg (325 mg Oral Refused 02/17/19 2217)  LORazepam (ATIVAN) tablet 1-4 mg (has no administration in time range)    Or  LORazepam (ATIVAN) injection 1-4 mg (has no administration in time range)  thiamine (VITAMIN B-1) tablet 100 mg (100 mg Oral Refused 02/17/19 2218)    Or  thiamine (B-1) injection 100 mg ( Intravenous See Alternative 05/24/56 5277)  folic acid (FOLVITE) tablet 1 mg (1 mg Oral Refused 02/17/19 2218)  multivitamin with minerals tablet 1 tablet (1 tablet Oral Refused 02/17/19 2218)  LORazepam (ATIVAN) tablet 0-4 mg (0 mg Oral Not Given 02/17/19 2218)    Followed by  LORazepam (ATIVAN) tablet 0-4 mg (has no administration in time range)  0.9 %  sodium chloride infusion ( Intravenous New Bag/Given 02/17/19 2231)  acetaminophen (TYLENOL) tablet 650 mg (has no administration in time range)    Or  acetaminophen (TYLENOL) suppository 650 mg (has no administration in time range)  ondansetron (ZOFRAN) tablet 4 mg (has no administration in time range)    Or  ondansetron (ZOFRAN) injection 4 mg (has no administration in time range)  HYDROcodone-acetaminophen (NORCO/VICODIN) 5-325 MG per tablet 1-2 tablet (has no administration in time range)  piperacillin-tazobactam (ZOSYN) IVPB 3.375 g (has no administration in time range)  vancomycin (VANCOCIN) 1,250 mg in sodium chloride 0.9 % 250 mL IVPB (has no administration in time range)   piperacillin-tazobactam (ZOSYN) IVPB 3.375 g (0 g Intravenous Stopped 02/17/19 1831)  vancomycin (VANCOCIN) 1,500 mg in sodium chloride 0.9 % 500 mL IVPB (0 mg Intravenous Stopped 02/17/19 2129)  iohexol (OMNIPAQUE) 300 MG/ML solution 75 mL (75 mLs Intravenous Contrast Given 02/17/19 1902)  Tdap (BOOSTRIX) injection 0.5 mL (0.5 mLs Intramuscular Given 02/17/19 2234)     Initial Impression / Assessment and Plan / ED Course  I have reviewed the triage vital signs and the nursing notes.  Pertinent labs & imaging results that were available during my care of the patient were reviewed by me and considered in my medical decision making (see chart for details).  Clinical Course as of Feb 18 19  Sun Feb 17, 2019  1730 Initial exam performed.  Code Sepsis initiated. Patient is tachycardic with a WBC of 12.    [CC]    Clinical Course User Index [CC] Cheek, Vesta Mixer B, PA-C    Georg RuddleJosh Hizer is a 53 year old male who presented to the ED due to progressively worsening left arm pain and edema after an injury on Tuesday. On initial exam, patient was tachycardic with a leukocytosis of 12. Code sepsis was initiated. Blood cultures and lactic acid obtained. Will start patient on IV vancomycin and Zosyn once blood cultures obtained and IV access started.  Will obtain CT of left forearm, wrist, and hand to check for abscess formation or bony fractures from injury. CT of forearm was negative for fluid/abscess collection, but displayed significant edema. Dr. Hyacinth MeekerMiller spoke to Opyd about patient's case. Dr. Antionette Charpyd agreed to admit patient.   Final Clinical Impressions(s) / ED Diagnoses   Final diagnoses:  Sepsis without acute organ dysfunction, due to unspecified organism [facility](HCC)  Cellulitis of left upper extremity    ED Discharge Orders    None       Lorelle FormosaCheek,  B, PA-C 02/18/19 0020    Eber HongMiller, Brian, MD 02/21/19 [phone]

## 2019-02-18 LAB — COMPREHENSIVE METABOLIC PANEL
ALT: 24 U/L (ref 0–44)
AST: 47 U/L — ABNORMAL HIGH (ref 15–41)
Albumin: 2.5 g/dL — ABNORMAL LOW (ref 3.5–5.0)
Alkaline Phosphatase: 65 U/L (ref 38–126)
Anion gap: 10 (ref 5–15)
BUN: 6 mg/dL (ref 6–20)
CO2: 24 mmol/L (ref 22–32)
Calcium: 8.7 mg/dL — ABNORMAL LOW (ref 8.9–10.3)
Chloride: 102 mmol/L (ref 98–111)
Creatinine, Ser: 0.67 mg/dL (ref 0.61–1.24)
GFR calc Af Amer: >60 mL/min (ref >60)
GFR calc non Af Amer: >60 mL/min (ref >60)
Glucose, Bld: 84 mg/dL (ref 70–99)
Potassium: 3.4 mmol/L — ABNORMAL LOW (ref 3.5–5.1)
Sodium: 136 mmol/L (ref 135–145)
Total Bilirubin: 1.9 mg/dL — ABNORMAL HIGH (ref 0.3–1.2)
Total Protein: 6.5 g/dL (ref 6.5–8.1)

## 2019-02-18 LAB — PHOSPHORUS: Phosphorus: 2.2 mg/dL — ABNORMAL LOW (ref 2.5–4.6)

## 2019-02-18 LAB — MAGNESIUM: Magnesium: 1.6 mg/dL — ABNORMAL LOW (ref 1.7–2.4)

## 2019-02-18 MED ORDER — POLYETHYLENE GLYCOL 3350 17 G PO PACK: 17.0000 g | PACK | Freq: Every day | ORAL | Status: DC | PRN

## 2019-02-18 MED ORDER — SODIUM CHLORIDE 0.9% FLUSH: 3.0000 mL | Freq: Two times a day (BID) | INTRAVENOUS | Status: DC

## 2019-02-18 MED ORDER — POTASSIUM & SODIUM PHOSPHATES 280-160-250 MG PO PACK
1.0000 | PACK | Freq: Three times a day (TID) | ORAL | Status: DC
  Administered 2019-02-18: 22:00:00 1 via ORAL
  Filled 2019-02-18 (×9): qty 1

## 2019-02-18 MED ORDER — MAGNESIUM OXIDE 400 (241.3 MG) MG PO TABS
400.0000 mg | ORAL_TABLET | Freq: Two times a day (BID) | ORAL | Status: DC
  Administered 2019-02-18 (×2): 400 mg via ORAL
  Filled 2019-02-18 (×3): qty 1

## 2019-02-18 MED ORDER — SODIUM CHLORIDE 0.9 % IV SOLN: 250.0000 mL | INTRAVENOUS | Status: DC | PRN

## 2019-02-18 MED ORDER — SODIUM CHLORIDE 0.9% FLUSH: 3.0000 mL | INTRAVENOUS | Status: DC | PRN

## 2019-02-18 NOTE — Plan of Care (Signed)
  Problem: Education: Goal: Knowledge of General Education information will improve Description: Including pain rating scale, medication(s)/side effects and non-pharmacologic comfort measures Outcome: Progressing   Problem: Health Behavior/Discharge Planning: Goal: Ability to manage health-related needs will improve Outcome: Progressing   Problem: Clinical Measurements: Goal: Ability to maintain clinical measurements within normal limits will improve Outcome: Progressing Goal: Respiratory complications will improve Outcome: Progressing Goal: Cardiovascular complication will be avoided Outcome: Progressing   Problem: Activity: Goal: Risk for activity intolerance will decrease Outcome: Progressing   Problem: Pain Managment: Goal: General experience of comfort will improve Outcome: Progressing   Problem: Safety: Goal: Ability to remain free from injury will improve Outcome: Progressing   Problem: Skin Integrity: Goal: Risk for impaired skin integrity will decrease Outcome: Progressing   

## 2019-02-18 NOTE — Plan of Care (Signed)

## 2019-02-18 NOTE — ED Notes (Signed)
Dr. Kyle at bedside.  

## 2019-02-18 NOTE — ED Notes (Signed)
Pen Outline of arm swelling done
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
## 2019-02-18 NOTE — Progress Notes (Signed)
Pt arrived to 6N24 via stretcher from ED. Received report from [place], Therapist, sports. See assessment. Will continue to monitor.

## 2019-02-19 LAB — CBC WITH DIFFERENTIAL/PLATELET
Abs Immature Granulocytes: 0.08 10*3/uL — ABNORMAL HIGH (ref 0.00–0.07)
Basophils Absolute: 0 10*3/uL (ref 0.0–0.1)
Basophils Relative: 0 %
Eosinophils Absolute: 0.3 10*3/uL (ref 0.0–0.5)
Eosinophils Relative: 4 %
HCT: 31.5 % — ABNORMAL LOW (ref 39.0–52.0)
Hemoglobin: 10.8 g/dL — ABNORMAL LOW (ref 13.0–17.0)
Immature Granulocytes: 1 %
Lymphocytes Relative: 13 %
Lymphs Abs: 0.9 10*3/uL (ref 0.7–4.0)
MCH: 35.3 pg — ABNORMAL HIGH (ref 26.0–34.0)
MCHC: 34.3 g/dL (ref 30.0–36.0)
MCV: 102.9 fL — ABNORMAL HIGH (ref 80.0–100.0)
Monocytes Absolute: 0.8 10*3/uL (ref 0.1–1.0)
Monocytes Relative: 11 %
Neutro Abs: 4.7 10*3/uL (ref 1.7–7.7)
Neutrophils Relative %: 71 %
Platelets: 138 10*3/uL — ABNORMAL LOW (ref 150–400)
RBC: 3.06 MIL/uL — ABNORMAL LOW (ref 4.22–5.81)
RDW: 12.6 % (ref 11.5–15.5)
WBC: 6.8 10*3/uL (ref 4.0–10.5)
nRBC: 0 % (ref 0.0–0.2)

## 2019-02-19 LAB — RENAL FUNCTION PANEL
Albumin: 2.1 g/dL — ABNORMAL LOW (ref 3.5–5.0)
Anion gap: 11 (ref 5–15)
BUN: 7 mg/dL (ref 6–20)
CO2: 22 mmol/L (ref 22–32)
Calcium: 8.4 mg/dL — ABNORMAL LOW (ref 8.9–10.3)
Chloride: 105 mmol/L (ref 98–111)
Creatinine, Ser: 0.77 mg/dL (ref 0.61–1.24)
GFR calc Af Amer: >60 mL/min (ref >60)
GFR calc non Af Amer: >60 mL/min (ref >60)
Glucose, Bld: 72 mg/dL (ref 70–99)
Phosphorus: 3.6 mg/dL (ref 2.5–4.6)
Potassium: 3.3 mmol/L — ABNORMAL LOW (ref 3.5–5.1)
Sodium: 138 mmol/L (ref 135–145)

## 2019-02-19 LAB — GLUCOSE, CAPILLARY: Glucose-Capillary: 68 mg/dL — ABNORMAL LOW (ref 70–99)

## 2019-02-19 LAB — MAGNESIUM: Magnesium: 1.6 mg/dL — ABNORMAL LOW (ref 1.7–2.4)

## 2019-02-19 NOTE — Discharge Summary (Signed)
Brief discharge summary note  Patient is a 53 year old male with a history of cocaine abuse.  He presented secondary to left arm infection and found to have extensive cellulitis.  CT was also concerning for possible myositis.  Mention of possible foreign body was present in both the x-ray and CT scan.  When attempting to evaluate patient, it was noted that the patient was not in the room.  Belongings were gone and his gown was off on the bed.  Upon discussion with nursing, is found that the patient likely eloped.  Patient was being treated with IV vancomycin and cefepime.  Blood cultures are pending but are no growth x2 days.  No exam performed secondary to patient eloping.  Cordelia Poche, MD [facility] 02/19/2019, 12:58 PM

## 2019-02-19 NOTE — Progress Notes (Signed)
Notified by Dr. Teryl Lucy patient is not in his room. Called security, spoke with Nicole Kindred to look for patient. Patient's personal belonging are missing from room and patient still has IV in place.

## 2019-02-22 LAB — CULTURE, BLOOD (ROUTINE X 2)
Culture: NO GROWTH
Culture: NO GROWTH
Special Requests: ADEQUATE

## 2019-11-06 ENCOUNTER — Ambulatory Visit (INDEPENDENT_AMBULATORY_CARE_PROVIDER_SITE_OTHER): Payer: Self-pay | Admitting: Internal Medicine

## 2019-11-06 DIAGNOSIS — A46 Erysipelas: Secondary | ICD-10-CM

## 2019-11-11 ENCOUNTER — Encounter: Payer: Self-pay | Admitting: Internal Medicine

## 2019-11-11 ENCOUNTER — Emergency Department (HOSPITAL_COMMUNITY): Payer: Medicaid Other

## 2019-11-11 ENCOUNTER — Other Ambulatory Visit: Payer: Self-pay

## 2019-11-11 ENCOUNTER — Encounter (HOSPITAL_COMMUNITY): Payer: Self-pay | Admitting: Emergency Medicine

## 2019-11-11 ENCOUNTER — Other Ambulatory Visit (INDEPENDENT_AMBULATORY_CARE_PROVIDER_SITE_OTHER): Payer: Self-pay | Admitting: Internal Medicine

## 2019-11-11 ENCOUNTER — Inpatient Hospital Stay (HOSPITAL_COMMUNITY)
Admission: EM | Admit: 2019-11-11 | Discharge: 2019-11-26 | DRG: 640 | Disposition: A | Discharge status: Home Health | Payer: Medicaid Other | Attending: Internal Medicine | Admitting: Internal Medicine

## 2019-11-11 VITALS — HR 140

## 2019-11-11 DIAGNOSIS — M6282 Rhabdomyolysis: Secondary | ICD-10-CM | POA: Diagnosis present

## 2019-11-11 DIAGNOSIS — F1010 Alcohol abuse, uncomplicated: Secondary | ICD-10-CM

## 2019-11-11 DIAGNOSIS — E512 Wernicke's encephalopathy: Principal | ICD-10-CM | POA: Diagnosis present

## 2019-11-11 DIAGNOSIS — F202 Catatonic schizophrenia: Secondary | ICD-10-CM | POA: Diagnosis present

## 2019-11-11 DIAGNOSIS — L89210 Pressure ulcer of right hip, unstageable: Secondary | ICD-10-CM | POA: Diagnosis present

## 2019-11-11 DIAGNOSIS — E860 Dehydration: Secondary | ICD-10-CM

## 2019-11-11 DIAGNOSIS — G9341 Metabolic encephalopathy: Secondary | ICD-10-CM | POA: Diagnosis present

## 2019-11-11 DIAGNOSIS — H5500 Unspecified nystagmus: Secondary | ICD-10-CM | POA: Diagnosis present

## 2019-11-11 DIAGNOSIS — E440 Moderate protein-calorie malnutrition: Secondary | ICD-10-CM | POA: Diagnosis present

## 2019-11-11 DIAGNOSIS — A46 Erysipelas: Secondary | ICD-10-CM

## 2019-11-11 DIAGNOSIS — E876 Hypokalemia: Secondary | ICD-10-CM | POA: Insufficient documentation

## 2019-11-11 DIAGNOSIS — L8990 Pressure ulcer of unspecified site, unspecified stage: Secondary | ICD-10-CM | POA: Insufficient documentation

## 2019-11-11 DIAGNOSIS — D649 Anemia, unspecified: Secondary | ICD-10-CM | POA: Diagnosis present

## 2019-11-11 DIAGNOSIS — R7881 Bacteremia: Secondary | ICD-10-CM | POA: Diagnosis present

## 2019-11-11 DIAGNOSIS — Z20822 Contact with and (suspected) exposure to covid-19: Secondary | ICD-10-CM | POA: Diagnosis present

## 2019-11-11 DIAGNOSIS — F061 Catatonic disorder due to known physiological condition: Secondary | ICD-10-CM | POA: Diagnosis present

## 2019-11-11 DIAGNOSIS — F17200 Nicotine dependence, unspecified, uncomplicated: Secondary | ICD-10-CM | POA: Diagnosis present

## 2019-11-11 DIAGNOSIS — E538 Deficiency of other specified B group vitamins: Secondary | ICD-10-CM | POA: Diagnosis present

## 2019-11-11 DIAGNOSIS — F10231 Alcohol dependence with withdrawal delirium: Secondary | ICD-10-CM | POA: Diagnosis present

## 2019-11-11 LAB — COMPREHENSIVE METABOLIC PANEL
ALT: 34 U/L (ref 0–44)
AST: 44 U/L — ABNORMAL HIGH (ref 15–41)
Albumin: 2.8 g/dL — ABNORMAL LOW (ref 3.5–5.0)
Alkaline Phosphatase: 62 U/L (ref 38–126)
Anion gap: 11 (ref 5–15)
BUN: 15 mg/dL (ref 6–20)
CO2: 24 mmol/L (ref 22–32)
Calcium: 9.2 mg/dL (ref 8.9–10.3)
Chloride: 99 mmol/L (ref 98–111)
Creatinine, Ser: 0.69 mg/dL (ref 0.61–1.24)
GFR calc Af Amer: >60 mL/min (ref >60)
GFR calc non Af Amer: >60 mL/min (ref >60)
Glucose, Bld: 106 mg/dL — ABNORMAL HIGH (ref 70–99)
Potassium: 3.7 mmol/L (ref 3.5–5.1)
Sodium: 134 mmol/L — ABNORMAL LOW (ref 135–145)
Total Bilirubin: 1.2 mg/dL (ref 0.3–1.2)
Total Protein: 8.4 g/dL — ABNORMAL HIGH (ref 6.5–8.1)

## 2019-11-11 LAB — MAGNESIUM
Magnesium: 1.5 mg/dL — ABNORMAL LOW (ref 1.7–2.4)
Magnesium: 1.7 mg/dL (ref 1.7–2.4)

## 2019-11-11 LAB — URINALYSIS, ROUTINE W REFLEX MICROSCOPIC
Bacteria, UA: NONE SEEN
Bilirubin Urine: NEGATIVE
Glucose, UA: NEGATIVE mg/dL
Ketones, ur: 20 mg/dL — AB
Leukocytes,Ua: NEGATIVE
Nitrite: NEGATIVE
Protein, ur: NEGATIVE mg/dL
Specific Gravity, Urine: 1.016 (ref 1.005–1.030)
pH: 5 (ref 5.0–8.0)

## 2019-11-11 LAB — TROPONIN I (HIGH SENSITIVITY)
Troponin I (High Sensitivity): 8 ng/L (ref <18)
Troponin I (High Sensitivity): 9 ng/L (ref <18)

## 2019-11-11 LAB — CBC
HCT: 37.8 % — ABNORMAL LOW (ref 39.0–52.0)
Hemoglobin: 12.5 g/dL — ABNORMAL LOW (ref 13.0–17.0)
MCH: 33 pg (ref 26.0–34.0)
MCHC: 33.1 g/dL (ref 30.0–36.0)
MCV: 99.7 fL (ref 80.0–100.0)
Platelets: 207 10*3/uL (ref 150–400)
RBC: 3.79 MIL/uL — ABNORMAL LOW (ref 4.22–5.81)
RDW: 13 % (ref 11.5–15.5)
WBC: 10.2 10*3/uL (ref 4.0–10.5)
nRBC: 0 % (ref 0.0–0.2)

## 2019-11-11 LAB — PROTIME-INR
INR: 1 (ref 0.8–1.2)
Prothrombin Time: 12.8 seconds (ref 11.4–15.2)

## 2019-11-11 LAB — HIV ANTIBODY (ROUTINE TESTING W REFLEX): HIV Screen 4th Generation wRfx: NONREACTIVE

## 2019-11-11 LAB — SARS CORONAVIRUS 2 BY RT PCR (HOSPITAL ORDER, PERFORMED IN ~~LOC~~ HOSPITAL LAB): SARS Coronavirus 2: NEGATIVE

## 2019-11-11 LAB — AMMONIA: Ammonia: <9 umol/L — ABNORMAL LOW (ref 9–35)

## 2019-11-11 LAB — ETHANOL: Alcohol, Ethyl (B): <10 mg/dL (ref <10)

## 2019-11-11 LAB — VITAMIN B12: Vitamin B-12: 264 pg/mL (ref 180–914)

## 2019-11-11 LAB — CK: Total CK: 557 U/L — ABNORMAL HIGH (ref 49–397)

## 2019-11-11 LAB — LACTIC ACID, PLASMA: Lactic Acid, Venous: 1.4 mmol/L (ref 0.5–1.9)

## 2019-11-11 MED ORDER — SENNOSIDES-DOCUSATE SODIUM 8.6-50 MG PO TABS: 1.0000 | ORAL_TABLET | Freq: Every evening | ORAL | Status: DC | PRN

## 2019-11-11 MED ORDER — ADULT MULTIVITAMIN W/MINERALS CH
1.0000 | ORAL_TABLET | Freq: Every day | ORAL | Status: DC
  Administered 2019-11-11 – 2019-11-26 (×14): 1 via ORAL
  Filled 2019-11-11 (×15): qty 1

## 2019-11-11 MED ORDER — THIAMINE HCL 100 MG/ML IJ SOLN
100.0000 mg | Freq: Every day | INTRAMUSCULAR | Status: DC
  Administered 2019-11-11 – 2019-11-12 (×2): 100 mg via INTRAVENOUS
  Filled 2019-11-11 (×2): qty 2

## 2019-11-11 MED ORDER — MAGNESIUM SULFATE IN D5W 1-5 GM/100ML-% IV SOLN
1.0000 g | Freq: Once | INTRAVENOUS | Status: AC
  Administered 2019-11-11: 1 g via INTRAVENOUS
  Filled 2019-11-11: qty 100

## 2019-11-11 MED ORDER — ONDANSETRON HCL 4 MG PO TABS: 4.0000 mg | ORAL_TABLET | Freq: Four times a day (QID) | ORAL | Status: DC | PRN

## 2019-11-11 MED ORDER — ACETAMINOPHEN 650 MG RE SUPP: 650.0000 mg | Freq: Four times a day (QID) | RECTAL | Status: DC | PRN

## 2019-11-11 MED ORDER — ENOXAPARIN SODIUM 40 MG/0.4ML ~~LOC~~ SOLN
40.0000 mg | SUBCUTANEOUS | Status: DC
  Administered 2019-11-11 – 2019-11-25 (×10): 40 mg via SUBCUTANEOUS
  Filled 2019-11-11 (×12): qty 0.4

## 2019-11-11 MED ORDER — SODIUM CHLORIDE 0.9 % IV BOLUS
1000.0000 mL | Freq: Once | INTRAVENOUS | Status: AC
  Administered 2019-11-11: 1000 mL via INTRAVENOUS

## 2019-11-11 MED ORDER — ACETAMINOPHEN 325 MG PO TABS
650.0000 mg | ORAL_TABLET | Freq: Four times a day (QID) | ORAL | Status: DC | PRN
  Administered 2019-11-23: 650 mg via ORAL
  Filled 2019-11-11: qty 2

## 2019-11-11 MED ORDER — SODIUM CHLORIDE 0.9 % IV SOLN: INTRAVENOUS | Status: DC

## 2019-11-11 MED ORDER — POTASSIUM CHLORIDE CRYS ER 20 MEQ PO TBCR
40.0000 meq | EXTENDED_RELEASE_TABLET | Freq: Once | ORAL | Status: AC
  Administered 2019-11-11: 40 meq via ORAL
  Filled 2019-11-11: qty 2

## 2019-11-11 MED ORDER — ENSURE ENLIVE PO LIQD
237.0000 mL | Freq: Two times a day (BID) | ORAL | Status: DC
  Administered 2019-11-11 – 2019-11-25 (×10): 237 mL via ORAL

## 2019-11-11 MED ORDER — MAGNESIUM SULFATE 2 GM/50ML IV SOLN
2.0000 g | Freq: Once | INTRAVENOUS | Status: AC
  Administered 2019-11-11: 2 g via INTRAVENOUS
  Filled 2019-11-11: qty 50

## 2019-11-11 MED ORDER — FOLIC ACID 5 MG/ML IJ SOLN
1.0000 mg | Freq: Every day | INTRAMUSCULAR | Status: DC
  Administered 2019-11-11 – 2019-11-19 (×8): 1 mg via INTRAVENOUS
  Filled 2019-11-11 (×10): qty 0.2

## 2019-11-11 MED ORDER — ONDANSETRON HCL 4 MG/2ML IJ SOLN: 4.0000 mg | Freq: Four times a day (QID) | INTRAMUSCULAR | Status: DC | PRN

## 2019-11-11 MED ORDER — BISACODYL 5 MG PO TBEC
5.0000 mg | DELAYED_RELEASE_TABLET | Freq: Every day | ORAL | Status: DC | PRN
  Filled 2019-11-11: qty 1

## 2019-11-11 MED ORDER — HYDRALAZINE HCL 25 MG PO TABS: 25.0000 mg | ORAL_TABLET | Freq: Four times a day (QID) | ORAL | Status: DC | PRN

## 2019-11-11 NOTE — Progress Notes (Signed)
Received report from [facility] in ED.  Awaiting patient to be transferred to Room 1611.

## 2019-11-11 NOTE — ED Notes (Signed)
XR at bedside

## 2019-11-11 NOTE — ED Provider Notes (Signed)
[facility] Emergency Department Provider Note MRN:  [id number]  Arrival date & time: 11/11/19     Chief Complaint   Dehydration   History of Present Illness   Randall Garcia is a 54 y.o. year-old male with unknown past medical history presenting to the ED with chief complaint of dehydration.  Patient is sent here out of concern for his living conditions and concern for dehydration.  Reportedly has been sitting on his couch for 2 weeks and not caring for self.  Very concerning living conditions per EMS.  Patient largely has no complaints, though he does not seem to be of sound mind.  I was unable to obtain an accurate HPI, PMH, or ROS due to the patient's altered mental status.  Level 5 caveat.  Review of Systems  Positive for tachycardia.  Patient's Health History    Past Medical History:  Diagnosis Date  . Medical history non-contributory   . Seasonal allergies     History reviewed. No pertinent surgical history.  History reviewed. No pertinent family history.  Social History   Socioeconomic History  . Marital status: Single    Spouse name: Not on file  . Number of children: Not on file  . Years of education: Not on file  . Highest education level: Not on file  Occupational History  . Not on file  Tobacco Use  . Smoking status: Current Every Day Smoker  . Smokeless tobacco: Never Used  Substance and Sexual Activity  . Alcohol use: Yes  . Drug use: Yes    Types: Marijuana  . Sexual activity: Not on file  Other Topics Concern  . Not on file  Social History Narrative  . Not on file   Social Determinants of Health   Financial Resource Strain:   . Difficulty of Paying Living Expenses:   Food Insecurity:   . Worried About Charity fundraiser in the Last Year:   . Arboriculturist in the Last Year:   Transportation Needs:   . Film/video editor (Medical):   [name] Kitchen Lack of Transportation (Non-Medical):   Physical Activity:   . Days of  Exercise per Week:   . Minutes of Exercise per Session:   Stress:   . Feeling of Stress :   Social Connections:   . Frequency of Communication with Friends and Family:   . Frequency of Social Gatherings with Friends and Family:   . Attends Religious Services:   . Active Member of Clubs or Organizations:   . Attends Archivist Meetings:   [name] Kitchen Marital Status:   Intimate Partner Violence:   . Fear of Current or Ex-Partner:   . Emotionally Abused:   [name] Kitchen Physically Abused:   . Sexually Abused:      Physical Exam   Vitals:   11/11/19 1055 11/11/19 1216  BP: (!) 143/96 (!) 129/92  Pulse: (!) 120 100  Resp: (!) 24 16  Temp: 97.9 F (36.6 C)   SpO2: 100% 100%    CONSTITUTIONAL: Chronically ill-appearing, disheveled, NAD NEURO: Awake, alert, oriented to name, slow to answer questions, moves all extremities EYES:  eyes equal and reactive ENT/NECK:  no LAD, no JVD CARDIO: Tachycardic rate, well-perfused, normal S1 and S2 PULM:  CTAB no wheezing or rhonchi GI/GU:  normal bowel sounds, non-distended, non-tender MSK/SPINE:  No gross deformities, no edema SKIN:  no rash, atraumatic PSYCH:  Appropriate speech and behavior  *Additional and/or pertinent findings included in MDM below  Diagnostic and Interventional Summary    EKG Interpretation  Date/Time:  Monday November 11 2019 10:55:21 EDT Ventricular Rate:  119 PR Interval:    QRS Duration: 87 QT Interval:  331 QTC Calculation: 466 R Axis:   70 Text Interpretation: Sinus tachycardia Prolonged QT Confirmed by Kennis Carina [phone]) on 11/11/2019 12:09:47 PM      Labs Reviewed  CBC - Abnormal; Notable for the following components:      Result Value   RBC 3.79 (*)    Hemoglobin 12.5 (*)    HCT 37.8 (*)    All other components within normal limits  COMPREHENSIVE METABOLIC PANEL - Abnormal; Notable for the following components:   Sodium 134 (*)    Glucose, Bld 106 (*)    Total Protein 8.4 (*)    Albumin 2.8 (*)    AST  44 (*)    All other components within normal limits  CK - Abnormal; Notable for the following components:   Total CK 557 (*)    All other components within normal limits  CULTURE, BLOOD (ROUTINE X 2)  CULTURE, BLOOD (ROUTINE X 2)  SARS CORONAVIRUS 2 BY RT PCR (HOSPITAL ORDER, PERFORMED IN [facility])  LACTIC ACID, PLASMA  ETHANOL  PROTIME-INR  URINALYSIS, ROUTINE W REFLEX MICROSCOPIC  MAGNESIUM  TROPONIN I (HIGH SENSITIVITY)  TROPONIN I (HIGH SENSITIVITY)    CT HEAD WO CONTRAST  Final Result    DG Chest Port 1 View  Final Result      Medications  magnesium sulfate IVPB 2 g 50 mL (2 g Intravenous New Bag/Given (Non-Interop) 11/11/19 1238)  sodium chloride 0.9 % bolus 1,000 mL (1,000 mLs Intravenous New Bag/Given (Non-Interop) 11/11/19 1145)  sodium chloride 0.9 % bolus 1,000 mL (1,000 mLs Intravenous New Bag/Given (Non-Interop) 11/11/19 1146)     Procedures  /  Critical Care Procedures  ED Course and Medical Decision Making  I have reviewed the triage vital signs, the nursing notes, and pertinent available records from the EMR.  Listed above are laboratory and imaging tests that I personally ordered, reviewed, and interpreted and then considered in my medical decision making (see below for details).      Patient arrives tachycardic, normotensive, afebrile, mildly tachypneic, no hypoxia.  He has very little complaints but I am concerned for either altered mental status or cognitive impairment.  He has seen a doctor once in his life, during which she was admitted for sepsis of soft tissue origin last year.  He eloped from the hospital at that time according to chart review.  No signs of cellulitis today.  He is endorsing rib pain, otherwise he denies any issues at home.  A lot of concern for his living conditions.  I suspect dehydration, metabolic disarray, also considering rhabdomyolysis.  He is complaining of rib pain, will screen with EKG and troponin though this  would be atypical presentation for ACS.  Broad differential given his lack of ability to give a history, work-up pending.  Work-up largely unrevealing.  EKG does demonstrate prolonged QT, providing empiric magnesium, providing IV fluids.  Patient remains tachycardic despite IV fluid resuscitation, he continues to act somnolent and potentially altered, will request observation admission.  Elmer Sow. Pilar Plate, MD [facility] Emergency Medicine [facility] [email or web address]  Final Clinical Impressions(s) / ED Diagnoses     ICD-10-CM   1. Dehydration  E86.0     ED Discharge Orders    None       Discharge  Instructions Discussed with and Provided to Patient:   Discharge Instructions   None       Sabas Sous, MD 11/11/19 1329

## 2019-11-11 NOTE — ED Triage Notes (Signed)
Pt presents from home where he was living in unsatisfactory conditions and had been sitting on the sofa for 2 weeks. MD from a free clinic came to the house and looked at pt and noted he was emaciated and called [facility]. Pt tachycardic but has no other complaints. 20g LAC. NS given en route. Alert and oriented.

## 2019-11-11 NOTE — ED Notes (Signed)
Transport called to take patient to assigned bed. 

## 2019-11-11 NOTE — Progress Notes (Signed)
    Subjective:    Patient ID: Randall Garcia, male   DOB: [birth year], 54 y.o.   MRN: [id number]   HPI   Called emergently this morning by Wallace Going, police chaplain and minister for church group over near [place] in [place].  Stated Randall Garcia initially seemed to be improving with facial redness, swelling and crusting with first couple of days, but over the weekend just seems progressively worse.  Still not getting up off sofa and coming out of home.  Arrived at the home just before 9:30 a.m.and Mancel Parsons had been able to almost carry him out of the home onto a chair on the porch Denies fever, cough, nausea, vomiting, diarrhea.   His brother states he stopped drinking beer/malt liquor about 4 days ago.  He has been drinking water.  Not clear how much he is urinating.  He apparently has not moved from the couch where he was evaluated last week until brought out on chair today.   Current Meds  Medication Sig  . cephALEXin (KEFLEX) 500 MG capsule Take 500 mg by mouth 2 (two) times daily.   No Known Allergies   Review of Systems    Objective:   Pulse (!) 140   Physical Exam  Difficulty sitting up Clothes filthy with bad smell HEENT:  PERRL, EOMI, Facial erythema and swelling almost completely gone.  The crusting on his left ear essentially resolved.  Hair on left side of head clumped with crustiness, but no longer stuck to ear. Neck:  Supple, No adenopathy Chest:  CTA CV:  RRR without murmur or rub, Tachycardic.  Unable to palpate radial pulses.  Unable to register a BP.    Assessment & Plan  1.  Erysipelas:  Appears much improved with Cephalexin, Day 5/10  2.  Dehydration:  Appears severe with heartrate and inability to get a bp.  Spoke with Randall Garcia and he is finally agreeable to go to ED/possibly hospitalized for IV fluids and evaluation.  3.  Poor living conditions and alcoholism:  However, not clear he would find a safer spot for himself currently.

## 2019-11-11 NOTE — Progress Notes (Addendum)
    Subjective:    Patient ID: Randall Garcia, male   DOB: Jun 20, 1965, 54 y.o.   MRN: [id number]   HPI   Home Visit  Asked to see Mr. Andrus as a home visit by Mancel Parsons, police chaplain and minister for a church in the [facility] neighborhood.  He is living in a home in [place] area in very poor condition. I was shown photos of Mr. Deroy from 1 and 2 weeks ago with his face inflamed and swollen with yellow crusting at spots on nose.   His face is apparently much better now.  He has not received any treatment thus far and refuses to go to ED for evaluation.  Generally, Mr. Maher is out an about walking throughout the day in the neighborhood, but has not done so in past 2 weeks.  He apparently has not left the sofa he is on for some time.  Does not feel well enough to do sol.  Not clear what his nutrition has been, but his home is littered with empty 48 oz beer bottles, which is typical daily intake for all of the 3 male occupants of the home.  There is no electricity, but they do have ability to get water to help him wash his face.   No outpatient medications have been marked as taking for the 11/06/19 encounter (Office Visit) with Julieanne Manson, MD.   No Known Allergies   Review of Systems    Objective:   RR:  12 HR:  90  Physical Exam   Patient is sitting at one end of a sofa.  The house is full of flying insects.  There is a dog in the home.  About a 1-2 feet pile of rubbish, including the beer bottles surrounds all of the chairs/sofas.  Terrible odor.   Mr. Sheeler is covered in dirt, his clothing appears thickened with dirt.  His hair is long and matted, particularly against his left head where there is still significant crusting of the left ear and hair is adherent.   Still with some generalized erythema and swelling of his face, particularly of left ear and side of face and right suborbital area with edema.  I am unable to get close enough to him with all  the garbage to listen to anything but his anterior chest:  CTA CV:  RRR without murmur or rub .  Radial pulses normal and equal Abd:  S, + BS, NT  Assessment & Plan   1.  Facial cellulitis/erysipelas:  His brother, Kathlene November, agrees to wash his left ear and face area daily with mild soap and water. Cephalexin 500 mg twice daily prescription given to Mancel Parsons, who has funding to purchase.   He will give me an update in 2 days.   He is working with the men on a regular basis with regards to their home environment.  This currently is their safest option for housing.

## 2019-11-11 NOTE — ED Notes (Signed)
Full bed bath and linen change complete.

## 2019-11-11 NOTE — H&P (Addendum)
History and Physical    DAYVIAN BLIXT ZOX:[id number] DOB: 11-23-1965 DOA: 11/11/2019  PCP: Patient, No Pcp Per  Patient coming from: Home  I have personally briefly reviewed patient's old medical records in [place]  Chief Complaint: wt loss, dehydration  HPI: KHYRAN RIERA is a 54 y.o. male with no known past medical history (never sees doctors) who was brought to the ED via EMS after church members found patient minimally responsive, on his couch in his own urine and feces where he had apparently not moved from for at least 2 weeks.  Patient is nonverbal for me during encounter does not follow commands, but is awake.  History obtained was provided by Hedda Slade (patient's emergency contact in [facility]), who I spoke with by phone and had check on patient today.[name] Kitchen  He reports patient and his brother live in squalor in a home that should probably be condemned, have no electricity but thinks they may have water.  States patient has been an alcoholic since his early 80X and typically drinks 2-1/2 40 ounce beers each day.  The past reports that at baseline, the patient is very active in his neighborhood, but has been on his couch and entirely withdrawn from interaction with others for about 2 months.  The church brings food to him and his brother at their home, as patient and his brother will otherwise live off just alcohol.  He thinks it has been 4 to 5 days since patient last had any alcohol, but cannot be certain.  He reported that a local community physician "Dr. Eustaquio Maize" had done a house call to check on patient and prescribed an oral antibiotic because of what appeared to be a facial infection, and this had improved.  There is no known past psychiatric history other than substance abuse and addiction, but he states patient never sees doctors and has always "tried to live off the grid".  Unable to elicit any specific complaints from the patient at this time as he is nonverbal.  ED Course:  Tachycardic at 120 bpm, mildly hypertensive 143/96 and tachypneic respiratory rate 24, afebrile with O2 sat 100% on room air.  CBC notable for white count of 10.2, hemoglobin 12.5.  BMP only remarkable for sodium 134 and glucose 106.  Magnesium low at 1.5.  CK elevated at 557.  Troponin and lactic acid were normal.  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  Chest x-ray was negative.  EKG showed sinus tachycardia 19 bpm with normal axis and no acute ischemic changes.  Blood cultures were obtained, potassium and magnesium replaced.  Patient is admitted for observation, electrolyte repletion, and psychiatric evaluation.  Review of Systems: As per HPI otherwise 10 point review of systems negative.    Past Medical History:  Diagnosis Date  . Medical history non-contributory   . Seasonal allergies     History reviewed. No pertinent surgical history.   reports that he has been smoking. He has never used smokeless tobacco. He reports current alcohol use. He reports current drug use. Drug: Marijuana.  No Known Allergies  History reviewed. No pertinent family history.   Prior to Admission medications   Not on File    Physical Exam: Vitals:   11/11/19 1055 11/11/19 1216 11/11/19 1400 11/11/19 1545  BP: (!) 143/96 (!) 129/92 (!) 133/91 (!) 151/99  Pulse: (!) 120 100 87 (!) 107  Resp: (!) 24 16  16 (!) 22  Temp: 97.9 F (36.6 C)     TempSrc: Oral     SpO2: 100% 100% 99% 100%   Caveat: Physical exam is limited by patient's altered mental status and inability to follow commands.  Constitutional: Awake mostly with eyes closed, smells of urine, appears frail, disheveled and chronically ill Eyes: PERRL, lids and conjunctivae normal ENMT: Mucous membranes are dry. Poor dentition. Respiratory: CTAB anteriorly and laterally, no wheezing, no crackles. Normal respiratory effort. No accessory muscle use.  Cardiovascular:  Tachycardic, regular rhythm, no murmurs / rubs / gallops. No extremity edema.  Abdomen: soft, NT, ND, +Bowel sounds.  Musculoskeletal: No joint deformity upper and lower extremities. Normal muscle tone.  Skin: dry, intact, normal color, normal temperature Neurologic: Unable to assess neurologic status as patient nonverbal and not following commands.   Psychiatric: Catatonic.  Nonverbal.  Unable to assess if any SI or HI or hallucinations.    Labs on Admission: I have personally reviewed following labs and imaging studies  CBC: Recent Labs  Lab 11/11/19 1144  WBC 10.2  HGB 12.5*  HCT 37.8*  MCV 99.7  PLT 207   Basic Metabolic Panel: Recent Labs  Lab 11/11/19 1144  NA 134*  K 3.7  CL 99  CO2 24  GLUCOSE 106*  BUN 15  CREATININE 0.69  CALCIUM 9.2  MG 1.5*   GFR: CrCl cannot be calculated (Unknown ideal weight.). Liver Function Tests: Recent Labs  Lab 11/11/19 1144  AST 44*  ALT 34  ALKPHOS 62  BILITOT 1.2  PROT 8.4*  ALBUMIN 2.8*   No results for input(s): LIPASE, AMYLASE in the last 168 hours. No results for input(s): AMMONIA in the last 168 hours. Coagulation Profile: Recent Labs  Lab 11/11/19 1144  INR 1.0   Cardiac Enzymes: Recent Labs  Lab 11/11/19 1144  CKTOTAL 557*   BNP (last 3 results) No results for input(s): PROBNP in the last 8760 hours. HbA1C: No results for input(s): HGBA1C in the last 72 hours. CBG: No results for input(s): GLUCAP in the last 168 hours. Lipid Profile: No results for input(s): CHOL, HDL, LDLCALC, TRIG, CHOLHDL, LDLDIRECT in the last 72 hours. Thyroid Function Tests: No results for input(s): TSH, T4TOTAL, FREET4, T3FREE, THYROIDAB in the last 72 hours. Anemia Panel: No results for input(s): VITAMINB12, FOLATE, FERRITIN, TIBC, IRON, RETICCTPCT in the last 72 hours. Urine analysis:    Component Value Date/Time   COLORURINE YELLOW 02/17/2019 1949   APPEARANCEUR CLEAR 02/17/2019 1949   LABSPEC 1.019 02/17/2019 1949    PHURINE 5.0 02/17/2019 1949   GLUCOSEU NEGATIVE 02/17/2019 1949   HGBUR NEGATIVE 02/17/2019 1949   BILIRUBINUR NEGATIVE 02/17/2019 1949   KETONESUR 5 (A) 02/17/2019 1949   PROTEINUR NEGATIVE 02/17/2019 1949   NITRITE NEGATIVE 02/17/2019 1949   LEUKOCYTESUR NEGATIVE 02/17/2019 1949    Radiological Exams on Admission: CT HEAD WO CONTRAST  Result Date: 11/11/2019 CLINICAL DATA:  Altered mental status EXAM: CT HEAD WITHOUT CONTRAST TECHNIQUE: Contiguous axial images were obtained from the base of the skull through the vertex without intravenous contrast. COMPARISON:  11/06/2018 FINDINGS: Brain: Mild atrophic changes are noted. No findings to suggest acute hemorrhage, acute infarction or space-occupying mass lesion are seen. 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 Vascular: No hyperdense vessel or unexpected calcification. Skull: Normal. Negative for fracture or focal lesion. Sinuses/Orbits: No acute finding. Other: None. IMPRESSION: Chronic changes as described above without acute abnormality. Electronically Signed   By: Eulah Pont.D.  On: 11/11/2019 12:29   DG Chest Port 1 View  Result Date: 11/11/2019 CLINICAL DATA:  Failure to thrive. EXAM: PORTABLE CHEST 1 VIEW COMPARISON:  Single-view of the chest 11/07/2018. FINDINGS: Lungs clear. Heart size normal. Atherosclerosis. No pneumothorax or pleural fluid. Remote bilateral rib fractures again seen IMPRESSION: No acute disease. Aortic Atherosclerosis (ICD10-I70.0). Electronically Signed   By: Drusilla Kanner M.D.   On: 11/11/2019 11:42    EKG: Independently reviewed.  As above.  Assessment/Plan Principal Problem:   Acute metabolic encephalopathy Active Problems:   Rhabdomyolysis   Hypokalemia   Hypomagnesemia   Catatonia   Dehydration   Alcohol abuse    Acute metabolic encephalopathy / Catatonia -present on admission, with at least 2 weeks, possibly up to 2 months of patient not moving from his couch or caring for  himself in any respect, sitting in urine and feces.  Patient is nonverbal and does not follow commands.  Baseline, per patient's pastor, patient very active and socially engaged in his neighborhood. Patient has history of alcohol abuse/dependence since his early 69s.  Per chart review also history of cocaine abuse.  Labs only notable for low Mg, mildly elevated CK, minimal AST elevation with otherwise normal LFT's.  CT head was negative for acute findings.   --Psychiatry consulted --Neurochecks --B12, ammonia level pending --IV thiamine and folic acid  --follow cultures  --monitor closely for signs/symptoms of infection.  Will defer antibiotics for now, as no obvious infectious source.    Rhabdomyolysis - POA with CK of 557, likely due to his very prolonged immobility.  Renal function normal.  IV fluids.  Monitor CK and BMP.  Hypomagnesemia -POA with Mg 1.5.  Replacing by IV.  Repeat mag level with morning labs.  Replace as needed.  Hypoalbuminemia / Protein Calorie Malnutrition -due to chronic alcoholism and poor dietary habits.  Ensures between meals.  Dietician consult when patient mentation improves.  Dehydration - elevated CK, very little PO intake for past couple weeks at least.  Surprisingly renal function okay, CK is elevated.  IV fluids as above.  Alcohol abuse -last drink was estimated to be 4 to 5 days prior to admission, per patient's pastor.  Does not appear to be having withdrawal at this time and alcohol level was < 10.  Monitor with low threshold to initiate CIWA protocol and as needed Ativan.  Thiamine, folic acid, and multivitamin supplements   DVT prophylaxis: Lovenox   Code Status: Full (need to address with patient when his mentation improves as he was nonverbal during admission encounter) Family Communication: Spoke with patient's pastor, Ritta Slot, who is patient's emergency contact and CHL.  He states only family is patient's brother, Kathlene November, who is also alcoholic and  has no phone.  Mancel Parsons agrees to be point of contact for updates and questions.  His help is appreciated. Disposition Plan: To be determined pending clinical course and psychiatric evaluation Consults called: Psychiatry  Admission status:  Status is: Observation  The patient remains OBS appropriate and will d/c before 2 midnights.  Dispo: The patient is from: Home              Anticipated d/c is to: Home (pending PT, OT and psych evals)              Anticipated d/c date is: 1 day              Patient currently is not medically stable to d/c.    Pennie Banter, DO  [facility]  11/11/2019, 4:19 PM    If 7PM-7AM, please contact night-coverage. How to contact the [facility] Attending or Consulting provider 7A - 7P or covering provider during after hours 7P -7A, for this patient?    1. Check the care team in [facility] and look for a) attending/consulting TR[facility] provider listed and b) the [facility] team listed 2. Log into [email or web address] and use [place]'s universal password to access. If you do not have the password, please contact the hospital operator. 3. Locate the [facility] provider you are looking for under [facility] and page to a number that you can be directly reached. 4. If you still have difficulty reaching the provider, please page the [facility] (Director on Call) for the Hospitalists listed on amion for assistance.

## 2019-11-11 NOTE — ED Notes (Signed)
Labeled urine specimen and culture sent to lab. ENMiles 

## 2019-11-11 NOTE — ED Notes (Signed)
Unsuccessful attempt collecting second set of blood cultures.

## 2019-11-11 NOTE — ED Notes (Signed)
Patient transported to CT 

## 2019-11-12 DIAGNOSIS — D649 Anemia, unspecified: Secondary | ICD-10-CM | POA: Diagnosis present

## 2019-11-12 DIAGNOSIS — E876 Hypokalemia: Secondary | ICD-10-CM | POA: Diagnosis present

## 2019-11-12 DIAGNOSIS — F1010 Alcohol abuse, uncomplicated: Secondary | ICD-10-CM

## 2019-11-12 DIAGNOSIS — L8990 Pressure ulcer of unspecified site, unspecified stage: Secondary | ICD-10-CM | POA: Insufficient documentation

## 2019-11-12 DIAGNOSIS — E440 Moderate protein-calorie malnutrition: Secondary | ICD-10-CM | POA: Insufficient documentation

## 2019-11-12 DIAGNOSIS — E538 Deficiency of other specified B group vitamins: Secondary | ICD-10-CM | POA: Diagnosis present

## 2019-11-12 DIAGNOSIS — L89210 Pressure ulcer of right hip, unstageable: Secondary | ICD-10-CM | POA: Diagnosis present

## 2019-11-12 DIAGNOSIS — G9341 Metabolic encephalopathy: Secondary | ICD-10-CM | POA: Diagnosis present

## 2019-11-12 DIAGNOSIS — H5500 Unspecified nystagmus: Secondary | ICD-10-CM | POA: Diagnosis present

## 2019-11-12 DIAGNOSIS — M6282 Rhabdomyolysis: Secondary | ICD-10-CM | POA: Diagnosis present

## 2019-11-12 DIAGNOSIS — E512 Wernicke's encephalopathy: Secondary | ICD-10-CM | POA: Diagnosis not present

## 2019-11-12 DIAGNOSIS — E860 Dehydration: Secondary | ICD-10-CM

## 2019-11-12 DIAGNOSIS — F17200 Nicotine dependence, unspecified, uncomplicated: Secondary | ICD-10-CM | POA: Diagnosis present

## 2019-11-12 DIAGNOSIS — Z20822 Contact with and (suspected) exposure to covid-19: Secondary | ICD-10-CM | POA: Diagnosis present

## 2019-11-12 DIAGNOSIS — F10231 Alcohol dependence with withdrawal delirium: Secondary | ICD-10-CM | POA: Diagnosis present

## 2019-11-12 DIAGNOSIS — F061 Catatonic disorder due to known physiological condition: Secondary | ICD-10-CM | POA: Diagnosis present

## 2019-11-12 DIAGNOSIS — R7881 Bacteremia: Secondary | ICD-10-CM | POA: Diagnosis present

## 2019-11-12 DIAGNOSIS — F202 Catatonic schizophrenia: Secondary | ICD-10-CM | POA: Diagnosis present

## 2019-11-12 LAB — COMPREHENSIVE METABOLIC PANEL
ALT: 26 U/L (ref 0–44)
AST: 43 U/L — ABNORMAL HIGH (ref 15–41)
Albumin: 2.2 g/dL — ABNORMAL LOW (ref 3.5–5.0)
Alkaline Phosphatase: 52 U/L (ref 38–126)
Anion gap: 11 (ref 5–15)
BUN: 7 mg/dL (ref 6–20)
CO2: 21 mmol/L — ABNORMAL LOW (ref 22–32)
Calcium: 8.7 mg/dL — ABNORMAL LOW (ref 8.9–10.3)
Chloride: 102 mmol/L (ref 98–111)
Creatinine, Ser: 0.5 mg/dL — ABNORMAL LOW (ref 0.61–1.24)
GFR calc Af Amer: >60 mL/min (ref >60)
GFR calc non Af Amer: >60 mL/min (ref >60)
Glucose, Bld: 100 mg/dL — ABNORMAL HIGH (ref 70–99)
Potassium: 4.9 mmol/L (ref 3.5–5.1)
Sodium: 134 mmol/L — ABNORMAL LOW (ref 135–145)
Total Bilirubin: 0.9 mg/dL (ref 0.3–1.2)
Total Protein: 6.9 g/dL (ref 6.5–8.1)

## 2019-11-12 LAB — CBC
HCT: 36.9 % — ABNORMAL LOW (ref 39.0–52.0)
Hemoglobin: 12.5 g/dL — ABNORMAL LOW (ref 13.0–17.0)
MCH: 33.1 pg (ref 26.0–34.0)
MCHC: 33.9 g/dL (ref 30.0–36.0)
MCV: 97.6 fL (ref 80.0–100.0)
Platelets: 166 10*3/uL (ref 150–400)
RBC: 3.78 MIL/uL — ABNORMAL LOW (ref 4.22–5.81)
RDW: 12.9 % (ref 11.5–15.5)
WBC: 10 10*3/uL (ref 4.0–10.5)
nRBC: 0 % (ref 0.0–0.2)

## 2019-11-12 LAB — CK: Total CK: 299 U/L (ref 49–397)

## 2019-11-12 LAB — MAGNESIUM: Magnesium: 1.6 mg/dL — ABNORMAL LOW (ref 1.7–2.4)

## 2019-11-12 MED ORDER — THIAMINE HCL 100 MG/ML IJ SOLN
500.0000 mg | Freq: Three times a day (TID) | INTRAVENOUS | Status: AC
  Administered 2019-11-12 – 2019-11-14 (×6): 500 mg via INTRAVENOUS
  Filled 2019-11-12 (×6): qty 5

## 2019-11-12 MED ORDER — THIAMINE HCL 100 MG PO TABS
100.0000 mg | ORAL_TABLET | Freq: Every day | ORAL | Status: DC
  Administered 2019-11-20 – 2019-11-26 (×7): 100 mg via ORAL
  Filled 2019-11-12 (×7): qty 1

## 2019-11-12 MED ORDER — THIAMINE HCL 100 MG/ML IJ SOLN
250.0000 mg | INTRAVENOUS | Status: AC
  Administered 2019-11-15 – 2019-11-19 (×5): 250 mg via INTRAVENOUS
  Filled 2019-11-12 (×5): qty 2.5

## 2019-11-12 MED ORDER — NICOTINE 14 MG/24HR TD PT24
14.0000 mg | MEDICATED_PATCH | Freq: Every day | TRANSDERMAL | Status: DC
  Administered 2019-11-12 – 2019-11-26 (×12): 14 mg via TRANSDERMAL
  Filled 2019-11-12 (×15): qty 1

## 2019-11-12 MED ORDER — MAGNESIUM SULFATE 2 GM/50ML IV SOLN
2.0000 g | Freq: Once | INTRAVENOUS | Status: AC
  Administered 2019-11-12: 2 g via INTRAVENOUS
  Filled 2019-11-12: qty 50

## 2019-11-12 MED ORDER — CYANOCOBALAMIN 1000 MCG/ML IJ SOLN
1000.0000 ug | Freq: Once | INTRAMUSCULAR | Status: AC
  Administered 2019-11-12: 1000 ug via INTRAMUSCULAR
  Filled 2019-11-12: qty 1

## 2019-11-12 MED ORDER — VITAMIN B-12 1000 MCG PO TABS
1000.0000 ug | ORAL_TABLET | Freq: Every day | ORAL | Status: DC
  Filled 2019-11-12: qty 1

## 2019-11-12 NOTE — Progress Notes (Signed)
Initial Nutrition Assessment  DOCUMENTATION CODES:   Non-severe (moderate) malnutrition in context of social or environmental circumstances, Underweight  INTERVENTION:   Monitor magnesium, potassium, and phosphorus daily for at least 3 days, MD to replete as needed, as pt is at risk for refeeding syndrome.  -Ensure Enlive po BID, each supplement provides 350 kcal and 20 grams of protein -Magic cup BID with meals, each supplement provides 290 kcal and 9 grams of protein  NUTRITION DIAGNOSIS:   Moderate Malnutrition related to social / environmental circumstances (ETOH abuse) as evidenced by percent weight loss, moderate fat depletion, moderate muscle depletion.  GOAL:   Patient will meet greater than or equal to 90% of their needs  MONITOR:   PO intake, Supplement acceptance, Labs, Weight trends, I & O's, Skin  REASON FOR ASSESSMENT:   Malnutrition Screening Tool    ASSESSMENT:   54 y.o. male with no known past medical history (never sees doctors) who was brought to the ED via EMS after church members found patient minimally responsive, on his couch in his own urine and feces where he had apparently not moved from for at least 2 weeks.  Patient in room sleeping during RD visit. Did not waken to verbal stimuli. Pt with some confusion per chart review. Pt looks a bit disheveled. Pt with opened Ensure at bedside. Pt has had no breakfast but lunch was ordered for him. Suspect given history of poor living conditions and ETOH abuse, pt's PO intake has been poor but unable to verify at this time.  Will order Magic cups as well for additional kcals and protein. Pt is receiving multiple micronutrients IV for repletion. Would check phosphorus as well given ETOH abuse.  Per weight records, pt has lost 35 lbs since 02/17/19 (22% wt loss x 9 months, significant for time frame). Suspect malnutrition could be worse than diagnosis but unable to verify intakes PTA.   Medications: Vitamin B-12  injection, IV Folic acid, Multivitamin with minerals daily, IV Thiamine, IV Mg sulfate Labs reviewed: Low Na, Mg  NUTRITION - FOCUSED PHYSICAL EXAM:    Most Recent Value  Orbital Region Mild depletion  Upper Arm Region Moderate depletion  Thoracic and Lumbar Region Unable to assess  Buccal Region Moderate depletion  Temple Region Moderate depletion  Clavicle Bone Region Moderate depletion  Clavicle and Acromion Bone Region Moderate depletion  Scapular Bone Region Unable to assess  Dorsal Hand No depletion  Patellar Region Unable to assess  Anterior Thigh Region Unable to assess  Posterior Calf Region Unable to assess  Edema (RD Assessment) None  Hair Reviewed  Eyes Unable to assess  Mouth Unable to assess  Skin Reviewed  Nails Reviewed       Diet Order:   Diet Order            Diet regular Room service appropriate? Yes; Fluid consistency: Thin  Diet effective now                 EDUCATION NEEDS:   Not appropriate for education at this time  Skin:  Skin Assessment: Skin Integrity Issues: Skin Integrity Issues:: Unstageable, Stage II Unstageable: right hip  Last BM:  6/19  Height:   Ht Readings from Last 1 Encounters:  11/11/19 5\' 11"  (1.803 m)    Weight:   Wt Readings from Last 1 Encounters:  11/11/19 54.1 kg   BMI:  Body mass index is 16.63 kg/m.  Estimated Nutritional Needs:   Kcal:  1700-1900  Protein:  85-100g  Fluid:  1.9L/day   Tilda Franco, MS, RD, LDN Inpatient Clinical Dietitian Contact information available via Amion

## 2019-11-12 NOTE — Progress Notes (Addendum)
[facility] PROGRESS NOTE   WESS BANEY TZG:[id number] DOB: August 27, 1965 DOA: 11/11/2019  PCP: Patient, No Pcp Per  Brief History/Interval Summary: 54 y.o. male with no known past medical history (never sees doctors) who was brought to the ED via EMS after church members found patient minimally responsive, on his couch in his own urine and feces where he had apparently not moved from for at least 2 weeks.    Patient was nonverbal at the time of initial presentation.  Apparently he lives with his brother who also is in a similar situation.  Apparently drinks a lot of alcohol.  Most of the history was provided by patient's pastor.    Reason for Visit: Acute metabolic encephalopathy  Consultants: None  Procedures: None  Antibiotics: Anti-infectives (From admission, onward)   None      Subjective/Interval History: Noted to be confused.  But he does answer few questions.  Not nonverbal as he was yesterday.  Does follow commands but does not answer appropriately.  Denies any pain.    Assessment/Plan:  Acute metabolic encephalopathy/catatonia/Wernicke's This was present on admission.  Patient did not have any focal deficits.  CT head was negative for acute findings.  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  Patient seems to have improved some as he is speaking a few words but again answering inappropriately.  Does follow commands.  Seems to be confused.  This is likely due to nutritional deficiencies.  Patient drinks a lot of alcohol.  He is noted to have nystagmus.  He is likely thiamine deficient.  Vitamin B12 is 264.  This will need to be repleted.  We will also check folate level.  No neuro deficits noted.  PT and OT evaluation.  High-dose thiamine for few days.  Mild rhabdomyolysis CK was noted to be 557.  Patient was started on IV fluids.  Recheck tomorrow.  Blood work not done today.  We will order.  Hypomagnesemia This was repleted yesterday.  Recheck was normal.  Moderate Protein  calorie malnutrition Due to chronic alcoholism as well as poor oral intake.  Dietitian to see.  Dehydration IV fluids.  History of alcohol abuse Monitor closely for withdrawal symptoms.  Tobacco abuse Nicotine patch  Pressure injury Pressure Injury 11/11/19 Hip Right Unstageable - Full thickness tissue loss in which the base of the injury is covered by slough (yellow, tan, gray, green or brown) and/or eschar (tan, brown or black) in the wound bed. (Active)  11/11/19 1815  Location: Hip  Location Orientation: Right  Staging: Unstageable - Full thickness tissue loss in which the base of the injury is covered by slough (yellow, tan, gray, green or brown) and/or eschar (tan, brown or black) in the wound bed.  Wound Description (Comments):   Present on Admission: Yes     Pressure Injury 11/11/19 Other (Comment) Left;Lateral Stage 2 -  Partial thickness loss of dermis presenting as a shallow open injury with a red, pink wound bed without slough. (Active)  11/11/19 1815  Location: Other (Comment)  Location Orientation: Left;Lateral  Staging: Stage 2 -  Partial thickness loss of dermis presenting as a shallow open injury with a red, pink wound bed without slough.  Wound Description (Comments):   Present on Admission: Yes     DVT Prophylaxis: Lovenox Code Status: Full code Family Communication: No family at bedside Disposition Plan:  Status is: Inpatient  Remains inpatient appropriate because:Altered mental status and IV treatments appropriate due to intensity of illness or inability to take  PO   Dispo: The patient is from: Home              Anticipated d/c is to: SNF              Anticipated d/c date is: 3 days              Patient currently is not medically stable to d/c.       Medications:  Scheduled: . enoxaparin (LOVENOX) injection  40 mg Subcutaneous Q24H  . feeding supplement (ENSURE ENLIVE)  237 mL Oral BID BM  . folic acid  1 mg Intravenous Daily  . multivitamin  with minerals  1 tablet Oral Daily  . nicotine  14 mg Transdermal Daily  . thiamine injection  100 mg Intravenous Daily   Continuous: . sodium chloride    . sodium chloride 100 mL/hr at 11/12/19 0948   PNT:[id number] **OR** acetaminophen, bisacodyl, hydrALAZINE, ondansetron **OR** ondansetron (ZOFRAN) IV, senna-docusate   Objective:  Vital Signs  Vitals:   11/11/19 1755 11/11/19 1856 11/11/19 1957 11/12/19 0603  BP: 122/85 (!) 115/98  (!) 151/95  Pulse: (!) 102 88  93  Resp: 16 16  16   Temp:  99.3 F (37.4 C)  98 F (36.7 C)  TempSrc:  Oral  Oral  SpO2: 99% 98%  98%  Weight:   54.1 kg   Height:   5\' 11"  (1.803 m)    No intake or output data in the 24 hours ending 11/12/19 1153 Filed Weights   11/11/19 1957  Weight: 54.1 kg    General appearance: Awake alert.  In no distress.  Disoriented Resp: Clear to auscultation bilaterally.  Normal effort Cardio: S1-S2 is normal regular.  No S3-S4.  No rubs murmurs or bruit GI: Abdomen is soft.  Nontender nondistended.  Bowel sounds are present normal.  No masses organomegaly Extremities: No edema.  Full range of motion of lower extremities. Neurologic: Noted to have horizontal nystagmus with left gaze. no focal neurological deficits.    Lab Results:  Data Reviewed: I have personally reviewed following labs and imaging studies  CBC: Recent Labs  Lab 11/11/19 1144  WBC 10.2  HGB 12.5*  HCT 37.8*  MCV 99.7  PLT 207    Basic Metabolic Panel: Recent Labs  Lab 11/11/19 1144 11/11/19 1941  NA 134*  --   K 3.7  --   CL 99  --   CO2 24  --   GLUCOSE 106*  --   BUN 15  --   CREATININE 0.69  --   CALCIUM 9.2  --   MG 1.5* 1.7    GFR: Estimated Creatinine Clearance: 80.8 mL/min (by C-G formula based on SCr of 0.69 mg/dL).  Liver Function Tests: Recent Labs  Lab 11/11/19 1144  AST 44*  ALT 34  ALKPHOS 62  BILITOT 1.2  PROT 8.4*  ALBUMIN 2.8*     Recent Labs  Lab 11/11/19 1941  AMMONIA <9*     Coagulation Profile: Recent Labs  Lab 11/11/19 1144  INR 1.0    Cardiac Enzymes: Recent Labs  Lab 11/11/19 1144  CKTOTAL 557*    Anemia Panel: Recent Labs    11/11/19 1941  VITAMINB12 264    Recent Results (from the past 240 hour(s))  Culture, blood (Routine X 2) w Reflex to ID Panel     Status: None (Preliminary result)   Collection Time: 11/11/19 11:44 AM   Specimen: BLOOD  Result Value Ref Range Status  Specimen Description   Final    BLOOD LEFT ANTECUBITAL Performed at [facility], [address] [place], [place] [address]    Special Requests   Final    BOTTLES DRAWN AEROBIC AND ANAEROBIC Blood Culture adequate volume Performed at [facility], [address], [place], [place] [address]    Culture   Final    NO GROWTH < 24 HOURS Performed at [facility], [address] [place], [place] [address]    Report Status PENDING  Incomplete  SARS Coronavirus 2 by RT PCR (hospital order, performed in [facility] hospital lab) Nasopharyngeal Nasopharyngeal Swab     Status: None   Collection Time: 11/11/19  2:41 PM   Specimen: Nasopharyngeal Swab  Result Value Ref Range Status   SARS Coronavirus 2 NEGATIVE NEGATIVE Final    Comment: (NOTE) SARS-CoV-2 target nucleic acids are NOT DETECTED.  The SARS-CoV-2 RNA is generally detectable in upper and lower respiratory specimens during the acute phase of infection. The lowest concentration of SARS-CoV-2 viral copies this assay can detect is 250 copies / mL. A negative result does not preclude SARS-CoV-2 infection and should not be used as the sole basis for treatment or other patient management decisions.  A negative result may occur with improper specimen collection / handling, submission of specimen other than nasopharyngeal swab, presence of viral mutation(s) within the areas targeted by this assay, and inadequate number of viral copies (<250 copies / mL). A negative  result must be combined with clinical observations, patient history, and epidemiological information.  Fact Sheet for Patients:   BoilerBrush.com.cy  Fact Sheet for Healthcare Providers: https://pope.com/  This test is not yet approved or  cleared by the [place] FDA and has been authorized for detection and/or diagnosis of SARS-CoV-2 by FDA under an Emergency Use Authorization (EUA).  This EUA will remain in effect (meaning this test can be used) for the duration of the COVID-19 declaration under Section 564(b)(1) of the Act, 21 U.S.C. section 360bbb-3(b)(1), unless the authorization is terminated or revoked sooner.  Performed at [facility], [address] [place], [place] [address]   Culture, blood (Routine X 2) w Reflex to ID Panel     Status: None (Preliminary result)   Collection Time: 11/11/19  2:55 PM   Specimen: BLOOD  Result Value Ref Range Status   Specimen Description   Final    BLOOD RIGHT ANTECUBITAL Performed at [facility], [address] [place], [place] [address]    Special Requests   Final    BOTTLES DRAWN AEROBIC AND ANAEROBIC Blood Culture adequate volume Performed at [facility], [address], [place], [place] [address]    Culture  Setup Time   Final    GRAM POSITIVE RODS AEROBIC BOTTLE ONLY CRITICAL RESULT CALLED TO, READ BACK BY AND VERIFIED WITH: Maricela Curet  [facility] [id number] FCP  Performed at [facility], [address] [place], [place] [address]    Culture GRAM POSITIVE RODS  Final   Report Status PENDING  Incomplete      Radiology Studies: CT HEAD WO CONTRAST  Result Date: 11/11/2019 CLINICAL DATA:  Altered mental status EXAM: CT HEAD WITHOUT CONTRAST TECHNIQUE: Contiguous axial images were obtained from the base of the skull through the vertex without intravenous contrast. COMPARISON:  11/06/2018 FINDINGS: Brain:  Mild atrophic changes are noted. No findings to suggest acute hemorrhage, acute infarction or space-occupying mass lesion  are seen. 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 Vascular: No hyperdense vessel or unexpected calcification. Skull: Normal. Negative for fracture or focal lesion. Sinuses/Orbits: No acute finding. Other: None. IMPRESSION: Chronic changes as described above without acute abnormality. Electronically Signed   By: Inez Catalina M.D.   On: 11/11/2019 12:29   DG Chest Port 1 View  Result Date: 11/11/2019 CLINICAL DATA:  Failure to thrive. EXAM: PORTABLE CHEST 1 VIEW COMPARISON:  Single-view of the chest 11/07/2018. FINDINGS: Lungs clear. Heart size normal. Atherosclerosis. No pneumothorax or pleural fluid. Remote bilateral rib fractures again seen IMPRESSION: No acute disease. Aortic Atherosclerosis (ICD10-I70.0). Electronically Signed   By: Inge Rise M.D.   On: 11/11/2019 11:42       LOS: 0 days   [facility] Pager on [email or web address]  11/12/2019, 11:53 AM

## 2019-11-12 NOTE — Plan of Care (Signed)
°  Problem: Clinical Measurements: Goal: Ability to maintain clinical measurements within normal limits will improve Outcome: Progressing Goal: Will remain free from infection Outcome: Progressing Goal: Diagnostic test results will improve Outcome: Progressing Goal: Respiratory complications will improve Outcome: Progressing Goal: Cardiovascular complication will be avoided Outcome: Progressing   Problem: Coping: Goal: Level of anxiety will decrease Outcome: Progressing   Problem: Elimination: Goal: Will not experience complications related to bowel motility Outcome: Progressing Goal: Will not experience complications related to urinary retention Outcome: Progressing   Problem: Safety: Goal: Ability to remain free from injury will improve Outcome: Progressing   Problem: Skin Integrity: Goal: Risk for impaired skin integrity will decrease Outcome: Progressing   Problem: Education: Goal: Knowledge of General Education information will improve Description: Including pain rating scale, medication(s)/side effects and non-pharmacologic comfort measures Outcome: Not Progressing   Problem: Health Behavior/Discharge Planning: Goal: Ability to manage health-related needs will improve Outcome: Not Progressing   Problem: Activity: Goal: Risk for activity intolerance will decrease Outcome: Not Progressing   Problem: Nutrition: Goal: Adequate nutrition will be maintained Outcome: Not Progressing   Problem: Pain Managment: Goal: General experience of comfort will improve Outcome: Not Progressing

## 2019-11-12 NOTE — Consult Note (Signed)
[facility] Face-to-Face Psychiatry Consult   Reason for Consult:  Catatonic State Referring Physician:  Osvaldo Shipper  Patient Identification: Randall Garcia MRN:  [id number] Principal Diagnosis: Acute metabolic encephalopathy Diagnosis:  Principal Problem:   Acute metabolic encephalopathy Active Problems:   Alcohol abuse   Rhabdomyolysis   Hypomagnesemia   Catatonia   Dehydration   Pressure injury of skin   Total Time spent with patient: 30 minutes  Subjective:   Randall Garcia is a 54 y.o. male patient admitted after church members found him minimally responsive on his couch sitting in his own feces and urine. Psych was consulted for catatonic state. Patient is seen and doesn't forward much information at this time. He states that he is here because his brother wouldn't keep his hands off of things. He does state he didn't get up for an extended period of time and doing so could lead to his death. However he denies any suicidal thoughts or suicidal intent in doing so. He reports alcohol use of about 80-100 ounces of beer a day. He denies any pas psychiatric history other than alcohol use. He denies suicidal ideation, homicidal ideations, and or hallucinations.   HPI:  54 y.o.malewithno known pastmedical history(never sees doctors) who wasbrought to the ED via EMS after church members found patient minimally responsive, on his couch inhis ownurine and feces where he had apparently not moved fromforat least 2 weeks.  Patient was nonverbal at the time of initial presentation.  Apparently he lives with his brother who also is in a similar situation.  Apparently drinks a lot of alcohol.  Most of the history was provided by patient's pastor.     During the evaluation patient is observed returning back to bed after completing his physical therapy session. Patient was alert and oriented, calm and cooperative, and grudgingly cooperative. He does not appear enthusing to engage well with  writer as evident by his poor eye contact. However he also appears to be lethargic and speaks in a soft tone. He denies any previous suicide attempts, ideations, gestures. He denies this current admission as a suicide attempt or self-neglect to lead to an admission. He does endorse wanting to seek help with finances, and living conditions. When inquiring about a nursing facility " I cant afford that. How will I pay to go there? I need to go there but I cant afford?" Patient does acknowledge his inability to care for himself any longer and his church has been an asset to him meeting all his needs. He agrees that he will benefit from long term care needs. He denies suicidal ideation.    Past Psychiatric History: History of alcohol abuse. Patient denies any past psych history. He denies any previous outpatient services, inpatient history, or psychiatric admissions. He denies any substance abuse. He denies any suicide attempts.   Risk to Self:  Denies Risk to Others:  Denies Prior Inpatient Therapy:   Denies Prior Outpatient Therapy:  Denies  Past Medical History:  Past Medical History:  Diagnosis Date  . Medical history non-contributory   . Seasonal allergies    History reviewed. No pertinent surgical history. Family History: History reviewed. No pertinent family history. Family Psychiatric  History: Denies Social History:  Social History   Substance and Sexual Activity  Alcohol Use Yes     Social History   Substance and Sexual Activity  Drug Use Yes  . Types: Marijuana    Social History   Socioeconomic History  . Marital status: Single  Spouse name: Not on file  . Number of children: Not on file  . Years of education: Not on file  . Highest education level: Not on file  Occupational History  . Not on file  Tobacco Use  . Smoking status: Current Every Day Smoker  . Smokeless tobacco: Never Used  Substance and Sexual Activity  . Alcohol use: Yes  . Drug use: Yes    Types:  Marijuana  . Sexual activity: Not on file  Other Topics Concern  . Not on file  Social History Narrative  . Not on file   Social Determinants of Health   Financial Resource Strain:   . Difficulty of Paying Living Expenses:   Food Insecurity:   . Worried About Charity fundraiser in the Last Year:   . Arboriculturist in the Last Year:   Transportation Needs:   . Film/video editor (Medical):   [name] Kitchen Lack of Transportation (Non-Medical):   Physical Activity:   . Days of Exercise per Week:   . Minutes of Exercise per Session:   Stress:   . Feeling of Stress :   Social Connections:   . Frequency of Communication with Friends and Family:   . Frequency of Social Gatherings with Friends and Family:   . Attends Religious Services:   . Active Member of Clubs or Organizations:   . Attends Archivist Meetings:   [name] Kitchen Marital Status:    Additional Social History:    Allergies:  No Known Allergies  Labs:  Results for orders placed or performed during the hospital encounter of 11/11/19 (from the past 48 hour(s))  Lactic acid, plasma     Status: None   Collection Time: 11/11/19 11:44 AM  Result Value Ref Range   Lactic Acid, Venous 1.4 0.5 - 1.9 mmol/L    Comment: Performed at [facility], [place] [address], [place], [place] [address]  CBC     Status: Abnormal   Collection Time: 11/11/19 11:44 AM  Result Value Ref Range   WBC 10.2 4.0 - 10.5 K/uL   RBC 3.79 (L) 4.22 - 5.81 MIL/uL   Hemoglobin 12.5 (L) 13.0 - 17.0 g/dL   HCT 37.8 (L) 39 - 52 %   MCV 99.7 80.0 - 100.0 fL   MCH 33.0 26.0 - 34.0 pg   MCHC 33.1 30.0 - 36.0 g/dL   RDW 13.0 11.5 - 15.5 %   Platelets 207 150 - 400 K/uL   nRBC 0.0 0.0 - 0.2 %    Comment: Performed at [facility], [place] [address], [place], [place] [address]  Comprehensive metabolic panel     Status: Abnormal   Collection Time: 11/11/19 11:44 AM  Result Value Ref Range   Sodium 134 (L) 135 - 145 mmol/L    Potassium 3.7 3.5 - 5.1 mmol/L   Chloride 99 98 - 111 mmol/L   CO2 24 22 - 32 mmol/L   Glucose, Bld 106 (H) 70 - 99 mg/dL    Comment: Glucose reference range applies only to samples taken after fasting for at least 8 hours.   BUN 15 6 - 20 mg/dL   Creatinine, Ser 0.69 0.61 - 1.24 mg/dL   Calcium 9.2 8.9 - 10.3 mg/dL   Total Protein 8.4 (H) 6.5 - 8.1 g/dL   Albumin 2.8 (L) 3.5 - 5.0 g/dL   AST 44 (H) 15 - 41 U/L   ALT 34 0 - 44 U/L   Alkaline  Phosphatase 62 38 - 126 U/L   Total Bilirubin 1.2 0.3 - 1.2 mg/dL   GFR calc non Af Amer >60 >60 mL/min   GFR calc Af Amer >60 >60 mL/min   Anion gap 11 5 - 15    Comment: Performed at [facility], [address] [place], [place] [address]  CK     Status: Abnormal   Collection Time: 11/11/19 11:44 AM  Result Value Ref Range   Total CK 557 (H) 49.0 - 397.0 U/L    Comment: Performed at [facility], [address] [place], [place] [address]  Ethanol     Status: None   Collection Time: 11/11/19 11:44 AM  Result Value Ref Range   Alcohol, Ethyl (B) <10 <10 mg/dL    Comment: (NOTE) Lowest detectable limit for serum alcohol is 10 mg/dL.  For medical purposes only. Performed at [facility], [address], [place], [place] [address]   Protime-INR     Status: None   Collection Time: 11/11/19 11:44 AM  Result Value Ref Range   Prothrombin Time 12.8 11.4 - 15.2 seconds   INR 1.0 0.8 - 1.2    Comment: (NOTE) INR goal varies based on device and disease states. Performed at [facility], [address], [place], [place] [address]   Troponin I (High Sensitivity)     Status: None   Collection Time: 11/11/19 11:44 AM  Result Value Ref Range   Troponin I (High Sensitivity) 8 <18 ng/L    Comment: (NOTE) Elevated high sensitivity troponin I (hsTnI) values and significant  changes across serial measurements may suggest ACS but many other  chronic and acute conditions  are known to elevate hsTnI results.  Refer to the "Links" section for chest pain algorithms and additional  guidance. Performed at [facility], [address] [place], [place] [address]   Culture, blood (Routine X 2) w Reflex to ID Panel     Status: None (Preliminary result)   Collection Time: 11/11/19 11:44 AM   Specimen: BLOOD  Result Value Ref Range   Specimen Description      BLOOD LEFT ANTECUBITAL Performed at [facility], [address] [place], [place] [address]    Special Requests      BOTTLES DRAWN AEROBIC AND ANAEROBIC Blood Culture adequate volume Performed at [facility], [address], [place], [place] [address]    Culture      NO GROWTH < 24 HOURS Performed at [facility], [address] [place], [place] [address]    Report Status PENDING   Magnesium     Status: Abnormal   Collection Time: 11/11/19 11:44 AM  Result Value Ref Range   Magnesium 1.5 (L) 1.7 - 2.4 mg/dL    Comment: Performed at [facility], [address], [place], [place] [address]  SARS Coronavirus 2 by RT PCR (hospital order, performed in [facility] hospital lab) Nasopharyngeal Nasopharyngeal Swab     Status: None   Collection Time: 11/11/19  2:41 PM   Specimen: Nasopharyngeal Swab  Result Value Ref Range   SARS Coronavirus 2 NEGATIVE NEGATIVE    Comment: (NOTE) SARS-CoV-2 target nucleic acids are NOT DETECTED.  The SARS-CoV-2 RNA is generally detectable in upper and lower respiratory specimens during the acute phase of infection. The lowest concentration of SARS-CoV-2 viral copies this assay can detect is 250 copies / mL. A negative result does  not preclude SARS-CoV-2 infection and should not be used as the sole basis for treatment or other patient management decisions.  A negative result may occur with improper specimen collection / handling, submission of specimen other than nasopharyngeal swab,  presence of viral mutation(s) within the areas targeted by this assay, and inadequate number of viral copies (<250 copies / mL). A negative result must be combined with clinical observations, patient history, and epidemiological information.  Fact Sheet for Patients:   BoilerBrush.com.cy  Fact Sheet for Healthcare Providers: https://pope.com/  This test is not yet approved or  cleared by the [place] FDA and has been authorized for detection and/or diagnosis of SARS-CoV-2 by FDA under an Emergency Use Authorization (EUA).  This EUA will remain in effect (meaning this test can be used) for the duration of the COVID-19 declaration under Section 564(b)(1) of the Act, 21 U.S.C. section 360bbb-3(b)(1), unless the authorization is terminated or revoked sooner.  Performed at [facility], [address], [place], [place] [address]   Culture, blood (Routine X 2) w Reflex to ID Panel     Status: None (Preliminary result)   Collection Time: 11/11/19  2:55 PM   Specimen: BLOOD  Result Value Ref Range   Specimen Description      BLOOD RIGHT ANTECUBITAL Performed at [facility], [address], [place], [place] [address]    Special Requests      BOTTLES DRAWN AEROBIC AND ANAEROBIC Blood Culture adequate volume Performed at [facility], [address], [place], [place] [address]    Culture  Setup Time      GRAM POSITIVE RODS AEROBIC BOTTLE ONLY CRITICAL RESULT CALLED TO, READ BACK BY AND VERIFIED WITH: Maricela Curet  [facility] [id number] FCP  Performed at [facility], [address] [place], [place] [address]    Culture GRAM POSITIVE RODS    Report Status PENDING   Troponin I (High Sensitivity)     Status: None   Collection Time: 11/11/19  2:55 PM  Result Value Ref Range   Troponin I (High Sensitivity) 9 <18 ng/L    Comment: (NOTE) Elevated high sensitivity troponin I  (hsTnI) values and significant  changes across serial measurements may suggest ACS but many other  chronic and acute conditions are known to elevate hsTnI results.  Refer to the "Links" section for chest pain algorithms and additional  guidance. Performed at [facility], [address] [place], [place] [address]   Urinalysis, Routine w reflex microscopic     Status: Abnormal   Collection Time: 11/11/19  3:48 PM  Result Value Ref Range   Color, Urine YELLOW YELLOW   APPearance CLEAR CLEAR   Specific Gravity, Urine 1.016 1.005 - 1.030   pH 5.0 5.0 - 8.0   Glucose, UA NEGATIVE NEGATIVE mg/dL   Hgb urine dipstick SMALL (A) NEGATIVE   Bilirubin Urine NEGATIVE NEGATIVE   Ketones, ur 20 (A) NEGATIVE mg/dL   Protein, ur NEGATIVE NEGATIVE mg/dL   Nitrite NEGATIVE NEGATIVE   Leukocytes,Ua NEGATIVE NEGATIVE   RBC / HPF 0-5 0 - 5 RBC/hpf   WBC, UA 0-5 0 - 5 WBC/hpf   Bacteria, UA NONE SEEN NONE SEEN   Squamous Epithelial / LPF 0-5 0 - 5   Mucus PRESENT    Hyaline Casts, UA PRESENT     Comment: Performed at [facility], [address] [place], [place] [address]  HIV Antibody (routine testing w  rflx)     Status: None   Collection Time: 11/11/19  7:41 PM  Result Value Ref Range   HIV Screen 4th Generation wRfx Non Reactive Non Reactive    Comment: Performed at [facility], [address], [place], [place] [address]  Vitamin B12     Status: None   Collection Time: 11/11/19  7:41 PM  Result Value Ref Range   Vitamin B-12 264 180 - 914 pg/mL    Comment: (NOTE) This assay is not validated for testing neonatal or myeloproliferative syndrome specimens for Vitamin B12 levels. Performed at [facility], [address] [place], [place] [address]   Ammonia     Status: Abnormal   Collection Time: 11/11/19  7:41 PM  Result Value Ref Range   Ammonia <9 (L) 9 - 35 umol/L    Comment: Performed at [facility],  [address] [place], [place] [address]  Magnesium     Status: None   Collection Time: 11/11/19  7:41 PM  Result Value Ref Range   Magnesium 1.7 1.7 - 2.4 mg/dL    Comment: Performed at [facility], [address], [place], [place] [address]  CBC     Status: Abnormal   Collection Time: 11/12/19 12:29 PM  Result Value Ref Range   WBC 10.0 4.0 - 10.5 K/uL   RBC 3.78 (L) 4.22 - 5.81 MIL/uL   Hemoglobin 12.5 (L) 13.0 - 17.0 g/dL   HCT 69.6 (L) 39 - 52 %   MCV 97.6 80.0 - 100.0 fL   MCH 33.1 26.0 - 34.0 pg   MCHC 33.9 30.0 - 36.0 g/dL   RDW 29.5 28.4 - 13.2 %   Platelets 166 150 - 400 K/uL   nRBC 0.0 0.0 - 0.2 %    Comment: Performed at [facility], [address] [place], [place] [address]  Comprehensive metabolic panel     Status: Abnormal   Collection Time: 11/12/19 12:29 PM  Result Value Ref Range   Sodium 134 (L) 135 - 145 mmol/L   Potassium 4.9 3.5 - 5.1 mmol/L    Comment: DELTA CHECK NOTED NO VISIBLE HEMOLYSIS    Chloride 102 98 - 111 mmol/L   CO2 21 (L) 22 - 32 mmol/L   Glucose, Bld 100 (H) 70 - 99 mg/dL    Comment: Glucose reference range applies only to samples taken after fasting for at least 8 hours.   BUN 7 6 - 20 mg/dL   Creatinine, Ser 2.72 (L) 0.61 - 1.24 mg/dL   Calcium 8.7 (L) 8.9 - 10.3 mg/dL   Total Protein 6.9 6.5 - 8.1 g/dL   Albumin 2.2 (L) 3.5 - 5.0 g/dL   AST 43 (H) 15 - 41 U/L   ALT 26 0 - 44 U/L   Alkaline Phosphatase 52 38 - 126 U/L   Total Bilirubin 0.9 0.3 - 1.2 mg/dL   GFR calc non Af Amer >60 >60 mL/min   GFR calc Af Amer >60 >60 mL/min   Anion gap 11 5 - 15    Comment: Performed at [facility], [address], [place], [place] [address]  Magnesium     Status: Abnormal   Collection Time: 11/12/19 12:29 PM  Result Value Ref Range   Magnesium 1.6 (L) 1.7 - 2.4 mg/dL    Comment: Performed at [facility], [address] [place], [place] [address]  CK  Status: None   Collection Time: 11/12/19 12:29 PM  Result Value Ref Range   Total CK 299 49.0 - 397.0 U/L    Comment: Performed at [facility], [address] [place], [place] [id number]    Current Facility-Administered Medications  Medication Dose Route Frequency Provider Last Rate Last Admin  . 0.9 %  sodium chloride infusion   Intravenous Continuous Osvaldo ShipperKrishnan, Gokul, MD 75 mL/hr at 11/12/19 1438 New Bag at 11/12/19 1438  . acetaminophen (TYLENOL) tablet 650 mg  650 mg Oral Q6H PRN Esaw GrandchildGriffith, Kelly A, DO       Or  . acetaminophen (TYLENOL) suppository 650 mg  650 mg Rectal Q6H PRN Esaw GrandchildGriffith, Kelly A, DO      . bisacodyl (DULCOLAX) EC tablet 5 mg  5 mg Oral Daily PRN Esaw GrandchildGriffith, Kelly A, DO      . enoxaparin (LOVENOX) injection 40 mg  40 mg Subcutaneous Q24H Esaw GrandchildGriffith, Kelly A, DO   40 mg at 11/11/19 2111  . feeding supplement (ENSURE ENLIVE) (ENSURE ENLIVE) liquid 237 mL  237 mL Oral BID BM Esaw GrandchildGriffith, Kelly A, DO   237 mL at 11/12/19 0955  . folic acid injection 1 mg  1 mg Intravenous Daily Esaw GrandchildGriffith, Kelly A, DO   1 mg at 11/12/19 [id number]  . hydrALAZINE (APRESOLINE) tablet 25 mg  25 mg Oral Q6H PRN Esaw GrandchildGriffith, Kelly A, DO      . magnesium sulfate IVPB 2 g 50 mL  2 g Intravenous Once Osvaldo ShipperKrishnan, Gokul, MD      . multivitamin with minerals tablet 1 tablet  1 tablet Oral Daily Esaw GrandchildGriffith, Kelly A, DO   1 tablet at 11/12/19 0951  . nicotine (NICODERM CQ - dosed in mg/24 hours) patch 14 mg  14 mg Transdermal Daily Osvaldo ShipperKrishnan, Gokul, MD   14 mg at 11/12/19 1314  . ondansetron (ZOFRAN) tablet 4 mg  4 mg Oral Q6H PRN Esaw GrandchildGriffith, Kelly A, DO       Or  . ondansetron (ZOFRAN) injection 4 mg  4 mg Intravenous Q6H PRN Esaw GrandchildGriffith, Kelly A, DO      . senna-docusate (Senokot-S) tablet 1 tablet  1 tablet Oral QHS PRN Esaw GrandchildGriffith, Kelly A, DO      . thiamine 500mg  in normal saline (50ml) IVPB  500 mg Intravenous Q8H Osvaldo ShipperKrishnan, Gokul, MD 100 mL/hr at 11/12/19 1436 500 mg at 11/12/19 1436   Followed by  . [START  ON 11/15/2019] thiamine (B-1) 250 mg in sodium chloride 0.9 % 50 mL IVPB  250 mg Intravenous Q24H Osvaldo ShipperKrishnan, Gokul, MD       Followed by  . [START ON 11/20/2019] thiamine tablet 100 mg  100 mg Oral Daily Osvaldo ShipperKrishnan, Gokul, MD      . Melene Muller[START ON 11/13/2019] vitamin B-12 (CYANOCOBALAMIN) tablet 1,000 mcg  1,000 mcg Oral Daily Osvaldo ShipperKrishnan, Gokul, MD        Musculoskeletal: Strength & Muscle Tone: within normal limits Gait & Station: normal Patient leans: N/A  Psychiatric Specialty Exam: Physical Exam  Review of Systems  Blood pressure (!) 156/91, pulse (!) 109, temperature 98.2 F (36.8 C), temperature source Oral, resp. rate 18, height 5\' 11"  (1.803 m), weight 54.1 kg, SpO2 99 %.Body mass index is 16.63 kg/m.  General Appearance: Disheveled  Eye Contact:  Poor  Speech:  Clear and Coherent, Slow and lethargic  Volume:  Decreased  Mood:  Depressed and Dysphoric  Affect:  Congruent, Constricted and Flat  Thought Process:  Linear and Descriptions of Associations: Intact  Orientation:  Full (Time, Place, and Person)  Thought Content:  Logical  Suicidal Thoughts:  No  Homicidal Thoughts:  No  Memory:  Immediate;   Fair Recent;   Poor Remote;   Poor  Judgement:  Poor  Insight:  Shallow  Psychomotor Activity:  Normal  Concentration:  Concentration: Poor and Attention Span: Fair  Recall:  Poor  Fund of Knowledge:  Poor  Language:  Fair  Akathisia:  No  Handed:  Right  AIMS (if indicated):     Assets:  Communication Skills Desire for Improvement Financial Resources/Insurance Housing  ADL's:  Intact  Cognition:  Impaired,  Mild  Sleep:        Treatment Plan Summary: Plan Will recommend starting anti-depressant. mirtazapine 7.5mg  po qhs for depression, anxiety, and to increase his appetite. Patient with limited support system and instability. Patient does have some mental capacity to make decisions however he continues to have poor insight into to his mental condition. He minimizes his  depressive symptoms. He does appear to be hopeful about getting assistance for nursing services. He does not meet inpatient criteria or involuntary commitment at this time.   Disposition: No evidence of imminent risk to self or others at present.   Patient does not meet criteria for psychiatric inpatient admission. Supportive therapy provided about ongoing stressors. It is with recommendations that SW be consulted to assist with higher level of care such as SNF/LTAC. Patient is no longer able to care for himself, and has had 3 high medical acuity admissions.   Maryagnes Amos, FNP 11/12/2019 3:12 PM

## 2019-11-12 NOTE — Evaluation (Signed)
Physical Therapy Evaluation Patient Details Name: Randall Garcia MRN: [id number] DOB: [birth year] Today's Date: 11/12/2019   History of Present Illness  54 y.o. male with no known past medical history (never sees doctors) who was brought to the ED via EMS after church members found patient minimally responsive, on his couch in his own urine and feces where he had apparently not moved from for at least 2 weeks. Diagnosis: acute met encephalopahty, rhabdomyolysis.  Clinical Impression  On eval, pt required Mod assist for bed mobility. Attempted standing x 2 with +1 assist but pt unable. He was easily distracted during session and likely not putting forth full effort. Will continue to follow and progress activity as able. At this time, recommendation is for SNF.     Follow Up Recommendations SNF;Supervision/Assistance - 24 hour    Equipment Recommendations  Rolling walker with 5" wheels    Recommendations for Other Services       Precautions / Restrictions Precautions Precautions: Fall Restrictions Weight Bearing Restrictions: No      Mobility  Bed Mobility Overal bed mobility: Needs Assistance Bed Mobility: Supine to Sit;Sit to Supine     Supine to sit: Mod assist;HOB elevated Sit to supine: Mod assist;HOB elevated   General bed mobility comments: Increased time and multimodal, repeated cueing required. Assist for trunk and bil LEs.  Transfers Overall transfer level: Needs assistance   Transfers: Sit to/from Stand          General transfer comment: Attempted x 2 but pt stated "I cant do it." He was distracted and likely not putting forth full effort. He was able to stand with OT this am.  Ambulation/Gait                Stairs            Wheelchair Mobility    Modified Rankin (Stroke Patients Only)       Balance Overall balance assessment: Needs assistance Sitting-balance support: Bilateral upper extremity supported;Feet supported Sitting  balance-Leahy Scale: Fair     Standing balance support: Bilateral upper extremity supported;During functional activity Standing balance-Leahy Scale: Poor                               Pertinent Vitals/Pain Pain Assessment: Faces Faces Pain Scale: Hurts little more Pain Location: "everywhere" Pain Descriptors / Indicators: Discomfort;Sore Pain Intervention(s): Limited activity within patient's tolerance;Monitored during session;Repositioned    Home Living Family/patient expects to be discharged to:: Unsure Living Arrangements: Other relatives               Additional Comments: Per chart,  patient lives with his brother "off the grid" Two months ago patient active in the neighborbood but last two months patient sedentary with alcohol abuse    Prior Function Level of Independence: Needs assistance   Gait / Transfers Assistance Needed: Pt stated "all of a suddent.[name] KitchenMarland KitchenI couldn't do nothing." Very vague info provided.  ADL's / Homemaking Assistance Needed: unknown. Patient lives in squalor and found to be unkempt and sitting in own feces and urine.  Comments: Pt stated "all of a suddent.[name] KitchenMarland KitchenI couldn't do nothing." Very vague info provided.     Hand Dominance   Dominant Hand: Right    Extremity/Trunk Assessment   Upper Extremity Assessment Upper Extremity Assessment: Defer to OT evaluation    Lower Extremity Assessment Lower Extremity Assessment: Generalized weakness    Cervical / Trunk Assessment Cervical / Trunk Assessment:  Normal  Communication   Communication: No difficulties  Cognition Arousal/Alertness: Awake/alert Behavior During Therapy: WFL for tasks assessed/performed Overall Cognitive Status: No family/caregiver present to determine baseline cognitive functioning Area of Impairment: Problem solving;Safety/judgement;Following commands;Attention;Orientation                 Orientation Level: Disoriented to;Place;Time;Situation Current  Attention Level: Focused Memory: Decreased short-term memory Following Commands: Follows one step commands inconsistently Safety/Judgement: Decreased awareness of safety;Decreased awareness of deficits Awareness: Intellectual Problem Solving: Slow processing;Decreased initiation;Difficulty sequencing;Requires verbal cues;Requires tactile cues General Comments: easily distracted. conversation not always on topic      General Comments      Exercises     Assessment/Plan    PT Assessment Patient needs continued PT services  PT Problem List Decreased strength;Decreased mobility;Decreased activity tolerance;Decreased balance;Pain;Decreased cognition;Decreased knowledge of use of DME;Decreased safety awareness       PT Treatment Interventions DME instruction;Gait training;Therapeutic activities;Therapeutic exercise;Patient/family education;Balance training;Functional mobility training    PT Goals (Current goals can be found in the Care Plan section)  Acute Rehab PT Goals Patient Stated Goal: pt did not state any PT Goal Formulation: With patient Time For Goal Achievement: 11/26/19 Potential to Achieve Goals: Fair    Frequency Min 2X/week   Barriers to discharge Decreased caregiver support poor living conditions    Co-evaluation               AM-PAC PT "6 Clicks" Mobility  Outcome Measure Help needed turning from your back to your side while in a flat bed without using bedrails?: A Lot Help needed moving from lying on your back to sitting on the side of a flat bed without using bedrails?: A Lot Help needed moving to and from a bed to a chair (including a wheelchair)?: A Lot Help needed standing up from a chair using your arms (e.g., wheelchair or bedside chair)?: A Lot Help needed to walk in hospital room?: Total Help needed climbing 3-5 steps with a railing? : Total 6 Click Score: 10    End of Session Equipment Utilized During Treatment: Gait belt Activity Tolerance:  Patient tolerated treatment well Patient left: in bed;with call bell/phone within reach;with bed alarm set   PT Visit Diagnosis: Muscle weakness (generalized) (M62.81);Difficulty in walking, not elsewhere classified (R26.2)    Time: 1134-1155 PT Time Calculation (min) (ACUTE ONLY): 21 min   Charges:   PT Evaluation $PT Eval Low Complexity: 1 Low             P, PT Acute Rehabilitation  Office: [phone] Pager: [phone]       

## 2019-11-12 NOTE — Evaluation (Signed)
Occupational Therapy Evaluation Patient Details Name: Randall Garcia MRN: [id number] DOB: Jul 08, 1965 Today's Date: 11/12/2019    History of Present Illness Randall Garcia is a 54 y.o. male with no known past medical history (never sees doctors) who was brought to the ED via EMS after church members found patient minimally responsive, on his couch in his own urine and feces where he had apparently not moved from for at least 2 weeks   Clinical Impression   Randall Garcia is a 54 year old man admitted with altered mental status and weakness with history of ETOH. On evaluation patient presents with confusion, generalized weakness, poor activity tolerance and impaired balance resulting in inability to perform independent ADLs and safe functional mobility. Patient will benefit from skilled OT services to improve deficits and learn compensatory strategies as needed to improve functional abilities in order to return home. Patient unable to participate in goal setting and patient's prior level reportedly poor in the last two months. Patient would benefit from short term rehab at discharge if available - if not will try to maximize patient's abilities while in hospital in order to discharge home.    Follow Up Recommendations  SNF    Equipment Recommendations   (TBD)    Recommendations for Other Services       Precautions / Restrictions Precautions Precautions: Fall Restrictions Weight Bearing Restrictions: No      Mobility Bed Mobility Overal bed mobility: Needs Assistance Bed Mobility: Supine to Sit;Sit to Supine     Supine to sit: Min assist;HOB elevated Sit to supine: Mod assist      Transfers Overall transfer level: Needs assistance   Transfers: Sit to/from Stand Sit to Stand: Mod assist;+2 physical assistance;+2 safety/equipment         General transfer comment: Able to stand with mod assist at side of bed but unable to motor plan steps resulting in needing assistance  of two to return to bed. Patient not following commands consistently therefore returned to bed. Patient complaining of being "swimmy headed" at side of bed while at edge of bed.    Balance Overall balance assessment: Needs assistance Sitting-balance support: Feet unsupported;Single extremity supported Sitting balance-Leahy Scale: Fair     Standing balance support: Bilateral upper extremity supported;During functional activity Standing balance-Leahy Scale: Poor                             ADL either performed or assessed with clinical judgement   ADL Overall ADL's : Needs assistance/impaired Eating/Feeding: Set up;Sitting   Grooming: Set up;Wash/dry hands;Wash/dry face;Sitting   Upper Body Bathing: Moderate assistance;Cueing for sequencing;Sitting;Set up   Lower Body Bathing: Maximal assistance;+2 for physical assistance;Sit to/from stand;+2 for safety/equipment;Cueing for sequencing   Upper Body Dressing : Sitting;Moderate assistance;Set up;Cueing for sequencing   Lower Body Dressing: +2 for physical assistance;Sit to/from stand;+2 for safety/equipment;Total assistance Lower Body Dressing Details (indicate cue type and reason): total assist to donn oscks Toilet Transfer: +2 for safety/equipment;+2 for physical assistance;Stand-pivot;BSC;Moderate assistance   Toileting- Clothing Manipulation and Hygiene: Maximal assistance;+2 for physical assistance;+2 for safety/equipment;Sit to/from Nurse, children's Details (indicate cue type and reason): n/a Functional mobility during ADLs: +2 for physical assistance;+2 for safety/equipment;Rolling walker;Cueing for sequencing;Cueing for safety       Vision   Vision Assessment?: No apparent visual deficits     Perception     Praxis      Pertinent Vitals/Pain  Pain Assessment: No/denies pain     Hand Dominance Right   Extremity/Trunk Assessment Upper Extremity Assessment Upper Extremity Assessment:  Generalized weakness   Lower Extremity Assessment Lower Extremity Assessment: Defer to PT evaluation   Cervical / Trunk Assessment Cervical / Trunk Assessment: Normal   Communication Communication Communication: No difficulties   Cognition Arousal/Alertness: Awake/alert Behavior During Therapy: WFL for tasks assessed/performed Overall Cognitive Status: Impaired/Different from baseline Area of Impairment: Orientation;Attention;Memory;Safety/judgement;Awareness;Following commands;Problem solving                 Orientation Level: Disoriented to;Place;Time;Situation Current Attention Level: Focused Memory: Decreased short-term memory Following Commands: Follows one step commands with increased time Safety/Judgement: Decreased awareness of deficits Awareness: Intellectual Problem Solving: Slow processing;Decreased initiation;Requires verbal cues;Difficulty sequencing General Comments: per pastor patient not at baseline   General Comments       Exercises     Shoulder Instructions      Home Living Family/patient expects to be discharged to:: Unsure Living Arrangements: Other relatives                               Additional Comments: Per pastor patient lives with his brother "off the grid" Two months ago patient active in the neighborbood but last two months patient sedentary with alcohol abuse      Prior Functioning/Environment Level of Independence: Needs assistance  Gait / Transfers Assistance Needed: unknown. ADL's / Homemaking Assistance Needed: unknown. Patient lives in squalor and found to be unkempt and sitting in own feces and urine.            OT Problem List: Decreased strength;Decreased activity tolerance;Impaired balance (sitting and/or standing);Decreased safety awareness;Decreased cognition;Decreased knowledge of use of DME or AE      OT Treatment/Interventions: Self-care/ADL training;Therapeutic exercise;DME and/or AE  instruction;Therapeutic activities;Balance training;Cognitive remediation/compensation;Patient/family education;Manual therapy    OT Goals(Current goals can be found in the care plan section) Acute Rehab OT Goals OT Goal Formulation: Patient unable to participate in goal setting Time For Goal Achievement: 11/21/19 Potential to Achieve Goals: Fair  OT Frequency: Min 2X/week   Barriers to D/C: Decreased caregiver support;Inaccessible home environment          Co-evaluation              AM-PAC OT "6 Clicks" Daily Activity     Outcome Measure Help from another person eating meals?: A Little Help from another person taking care of personal grooming?: A Little Help from another person toileting, which includes using toliet, bedpan, or urinal?: A Lot Help from another person bathing (including washing, rinsing, drying)?: A Lot Help from another person to put on and taking off regular upper body clothing?: A Lot Help from another person to put on and taking off regular lower body clothing?: Total 6 Click Score: 13   End of Session Equipment Utilized During Treatment: Rolling walker;Gait belt Nurse Communication: Mobility status  Activity Tolerance: Patient tolerated treatment well Patient left: in bed;with call bell/phone within reach;with bed alarm set  OT Visit Diagnosis: Unsteadiness on feet (R26.81);Muscle weakness (generalized) (M62.81)                Time: 1610-9604 OT Time Calculation (min): 30 min Charges:  OT General Charges $OT Visit: 1 Visit OT Evaluation $OT Eval Moderate Complexity: 1 Mod OT Treatments $Self Care/Home Management : 8-22 mins  Indiana Pechacek, OTR/L Acute Care Rehab Services  Office [phone] Pager: [phone]   Waldron Session L  Donnelle Rubey 11/12/2019, 11:41 AM

## 2019-11-13 LAB — COMPREHENSIVE METABOLIC PANEL
ALT: 29 U/L (ref 0–44)
AST: 35 U/L (ref 15–41)
Albumin: 2.4 g/dL — ABNORMAL LOW (ref 3.5–5.0)
Alkaline Phosphatase: 53 U/L (ref 38–126)
Anion gap: 10 (ref 5–15)
BUN: 7 mg/dL (ref 6–20)
CO2: 27 mmol/L (ref 22–32)
Calcium: 8.7 mg/dL — ABNORMAL LOW (ref 8.9–10.3)
Chloride: 97 mmol/L — ABNORMAL LOW (ref 98–111)
Creatinine, Ser: 0.53 mg/dL — ABNORMAL LOW (ref 0.61–1.24)
GFR calc Af Amer: >60 mL/min (ref >60)
GFR calc non Af Amer: >60 mL/min (ref >60)
Glucose, Bld: 102 mg/dL — ABNORMAL HIGH (ref 70–99)
Potassium: 4.1 mmol/L (ref 3.5–5.1)
Sodium: 134 mmol/L — ABNORMAL LOW (ref 135–145)
Total Bilirubin: 0.9 mg/dL (ref 0.3–1.2)
Total Protein: 7.3 g/dL (ref 6.5–8.1)

## 2019-11-13 LAB — CULTURE, BLOOD (ROUTINE X 2): Special Requests: ADEQUATE

## 2019-11-13 LAB — CBC
HCT: 32.1 % — ABNORMAL LOW (ref 39.0–52.0)
Hemoglobin: 10.7 g/dL — ABNORMAL LOW (ref 13.0–17.0)
MCH: 32.8 pg (ref 26.0–34.0)
MCHC: 33.3 g/dL (ref 30.0–36.0)
MCV: 98.5 fL (ref 80.0–100.0)
Platelets: 212 10*3/uL (ref 150–400)
RBC: 3.26 MIL/uL — ABNORMAL LOW (ref 4.22–5.81)
RDW: 13 % (ref 11.5–15.5)
WBC: 10 10*3/uL (ref 4.0–10.5)
nRBC: 0 % (ref 0.0–0.2)

## 2019-11-13 LAB — MAGNESIUM: Magnesium: 1.7 mg/dL (ref 1.7–2.4)

## 2019-11-13 LAB — CK: Total CK: 141 U/L (ref 49–397)

## 2019-11-13 MED ORDER — HALOPERIDOL LACTATE 5 MG/ML IJ SOLN
2.0000 mg | Freq: Once | INTRAMUSCULAR | Status: AC
  Administered 2019-11-13: 2 mg via INTRAVENOUS
  Filled 2019-11-13: qty 1

## 2019-11-13 MED ORDER — LORAZEPAM 1 MG PO TABS: 0.0000 mg | ORAL_TABLET | Freq: Two times a day (BID) | ORAL | Status: AC

## 2019-11-13 MED ORDER — LORAZEPAM 1 MG PO TABS
0.0000 mg | ORAL_TABLET | Freq: Four times a day (QID) | ORAL | Status: AC
  Administered 2019-11-14: 1 mg via ORAL
  Filled 2019-11-13: qty 1

## 2019-11-13 MED ORDER — MAGNESIUM SULFATE 2 GM/50ML IV SOLN
2.0000 g | Freq: Once | INTRAVENOUS | Status: AC
  Administered 2019-11-13: 2 g via INTRAVENOUS
  Filled 2019-11-13: qty 50

## 2019-11-13 MED ORDER — LORAZEPAM 1 MG PO TABS: 1.0000 mg | ORAL_TABLET | ORAL | Status: AC | PRN

## 2019-11-13 MED ORDER — MAGNESIUM SULFATE 2 GM/50ML IV SOLN: 2.0000 g | Freq: Once | INTRAVENOUS | Status: DC

## 2019-11-13 MED ORDER — QUETIAPINE FUMARATE 25 MG PO TABS
25.0000 mg | ORAL_TABLET | Freq: Two times a day (BID) | ORAL | Status: DC
  Administered 2019-11-14 – 2019-11-19 (×10): 25 mg via ORAL
  Filled 2019-11-13 (×11): qty 1

## 2019-11-13 MED ORDER — LORAZEPAM 2 MG/ML IJ SOLN
1.0000 mg | INTRAMUSCULAR | Status: AC | PRN
  Administered 2019-11-13 – 2019-11-16 (×4): 1 mg via INTRAVENOUS
  Filled 2019-11-13 (×4): qty 1

## 2019-11-13 NOTE — Progress Notes (Addendum)
[facility] PROGRESS NOTE   CHARLESTON HANKIN QAS:[id number] DOB: 01-07-1966 DOA: 11/11/2019  PCP: Patient, No Pcp Per  Brief History/Interval Summary: 54 y.o. male with no known past medical history (never sees doctors) who was brought to the ED via EMS after church members found patient minimally responsive, on his couch in his own urine and feces where he had apparently not moved from for at least 2 weeks.    Patient was nonverbal at the time of initial presentation.  Apparently he lives with his brother who also is in a similar situation.  Apparently drinks a lot of alcohol.  Most of the history was provided by patient's pastor.    Reason for Visit: Acute metabolic encephalopathy  Consultants: Psychiatry  Procedures: None  Antibiotics: Anti-infectives (From admission, onward)   None      Subjective/Interval History: Patient noted to be confused and slightly agitated.  Does not answer questions appropriately.  Follows commands however.  Not appear to be in any discomfort.    Assessment/Plan:  Acute metabolic encephalopathy/catatonia/Wernicke's This was present on admission.  Patient did not have any focal deficits.  CT head was negative for acute findings.  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  Patient was nonverbal in the emergency room.  Now he is speaking however he is very confused.  Apparently has been refusing medications.  We will place him on Seroquel which might help.   His encephalopathy is most likely due to Wernicke's.  He apparently drinks alcohol every day and likely has nutritional deficiencies on top of that.  He is noted to have nystagmus.  He is likely deficient in thiamine which has been initiated at high dose.  Vitamin B12 is 264 and this is also being supplemented.  Check folate levels.  Check TSH.  Mild rhabdomyolysis CK was noted to be 557 and started on IV fluids.  CK level is now normal.  Renal function is normal.  Hypomagnesemia Magnesium is 1.7 today.   Give additional dose.  Moderate Protein calorie malnutrition Due to chronic alcoholism as well as poor oral intake.   Bacteremia Bacillus species noted in 1 set.  He is afebrile.  His WBC is normal.  Likely a contaminant.  Continue to monitor for now.  No antibiotics at this time.  Normocytic anemia Drop in hemoglobin is likely dilutional.  Check anemia panel.  B12 noted to be 264 as mentioned above.  Dehydration IV fluids.  History of alcohol abuse Monitor closely for withdrawal symptoms.  Tobacco abuse Nicotine patch  Pressure injury Pressure Injury 11/11/19 Hip Right Unstageable - Full thickness tissue loss in which the base of the injury is covered by slough (yellow, tan, gray, green or brown) and/or eschar (tan, brown or black) in the wound bed. (Active)  11/11/19 1815  Location: Hip  Location Orientation: Right  Staging: Unstageable - Full thickness tissue loss in which the base of the injury is covered by slough (yellow, tan, gray, green or brown) and/or eschar (tan, brown or black) in the wound bed.  Wound Description (Comments):   Present on Admission: Yes     Pressure Injury 11/11/19 Other (Comment) Left;Lateral Stage 2 -  Partial thickness loss of dermis presenting as a shallow open injury with a red, pink wound bed without slough. (Active)  11/11/19 1815  Location: Other (Comment)  Location Orientation: Left;Lateral  Staging: Stage 2 -  Partial thickness loss of dermis presenting as a shallow open injury with a red, pink wound bed without slough.  Wound Description (Comments):   Present on Admission: Yes     DVT Prophylaxis: Lovenox Code Status: Full code Family Communication: No family at bedside Disposition Plan:  Status is: Inpatient  Remains inpatient appropriate because:Altered mental status and IV treatments appropriate due to intensity of illness or inability to take PO   Dispo: The patient is from: Home              Anticipated d/c is to: SNF               Anticipated d/c date is: 3 days              Patient currently is not medically stable to d/c.       Medications:  Scheduled: . enoxaparin (LOVENOX) injection  40 mg Subcutaneous Q24H  . feeding supplement (ENSURE ENLIVE)  237 mL Oral BID BM  . folic acid  1 mg Intravenous Daily  . multivitamin with minerals  1 tablet Oral Daily  . nicotine  14 mg Transdermal Daily  . [START ON 11/20/2019] thiamine  100 mg Oral Daily  . vitamin B-12  1,000 mcg Oral Daily   Continuous: . sodium chloride 75 mL/hr at 11/12/19 1438  . thiamine injection 500 mg (11/13/19 0509)   Followed by  . [START ON 11/15/2019] thiamine injection     ONG:[id number] **OR** acetaminophen, bisacodyl, hydrALAZINE, ondansetron **OR** ondansetron (ZOFRAN) IV, senna-docusate   Objective:  Vital Signs  Vitals:   11/12/19 0603 11/12/19 1315 11/12/19 2121 11/13/19 0542  BP: (!) 151/95 (!) 156/91 111/79 (!) 137/92  Pulse: 93 (!) 109 (!) 105 (!) 109  Resp: 16 18 18 15   Temp: 98 F (36.7 C) 98.2 F (36.8 C) 98.3 F (36.8 C) 98.2 F (36.8 C)  TempSrc: Oral Oral Oral Oral  SpO2: 98% 99% 99% 99%  Weight:      Height:        Intake/Output Summary (Last 24 hours) at 11/13/2019 1245 Last data filed at 11/13/2019 0900 Gross per 24 hour  Intake 240 ml  Output 360 ml  Net -120 ml   Filed Weights   11/11/19 1957  Weight: 54.1 kg    General appearance: Attracted.  Awake and alert.  In no distress. Resp: Clear to auscultation bilaterally.  Normal effort Cardio: S1-S2 is normal regular.  No S3-S4.  No rubs murmurs or bruit GI: Abdomen is soft.  Nontender nondistended.  Bowel sounds are present normal.  No masses organomegaly Extremities: No edema.  Full range of motion of lower extremities. Neurologic: Disoriented. no focal neurological deficits.       Lab Results:  Data Reviewed: I have personally reviewed following labs and imaging studies  CBC: Recent Labs  Lab 11/11/19 1144 11/12/19 1229  11/13/19 0722  WBC 10.2 10.0 10.0  HGB 12.5* 12.5* 10.7*  HCT 37.8* 36.9* 32.1*  MCV 99.7 97.6 98.5  PLT 207 166 212    Basic Metabolic Panel: Recent Labs  Lab 11/11/19 1144 11/11/19 1941 11/12/19 1229 11/13/19 0722  NA 134*  --  134* 134*  K 3.7  --  4.9 4.1  CL 99  --  102 97*  CO2 24  --  21* 27  GLUCOSE 106*  --  100* 102*  BUN 15  --  7 7  CREATININE 0.69  --  0.50* 0.53*  CALCIUM 9.2  --  8.7* 8.7*  MG 1.5* 1.7 1.6* 1.7    GFR: Estimated Creatinine Clearance: 80.8 mL/min (A) (by C-G formula  based on SCr of 0.53 mg/dL (L)).  Liver Function Tests: Recent Labs  Lab 11/11/19 1144 11/12/19 1229 11/13/19 0722  AST 44* 43* 35  ALT 34 26 29  ALKPHOS 62 52 53  BILITOT 1.2 0.9 0.9  PROT 8.4* 6.9 7.3  ALBUMIN 2.8* 2.2* 2.4*     Recent Labs  Lab 11/11/19 1941  AMMONIA <9*    Coagulation Profile: Recent Labs  Lab 11/11/19 1144  INR 1.0    Cardiac Enzymes: Recent Labs  Lab 11/11/19 1144 11/12/19 1229 11/13/19 0722  CKTOTAL 557* 299 141    Anemia Panel: Recent Labs    11/11/19 1941  VITAMINB12 264    Recent Results (from the past 240 hour(s))  Culture, blood (Routine X 2) w Reflex to ID Panel     Status: None (Preliminary result)   Collection Time: 11/11/19 11:44 AM   Specimen: BLOOD  Result Value Ref Range Status   Specimen Description   Final    BLOOD LEFT ANTECUBITAL Performed at [facility], [address], [place], [place] [address]    Special Requests   Final    BOTTLES DRAWN AEROBIC AND ANAEROBIC Blood Culture adequate volume Performed at [facility], [address] [place], [place] [address]    Culture   Final    NO GROWTH 2 DAYS Performed at [facility], [address] [place], [place] [address]    Report Status PENDING  Incomplete  SARS Coronavirus 2 by RT PCR (hospital order, performed in [facility] hospital lab) Nasopharyngeal Nasopharyngeal Swab     Status: None    Collection Time: 11/11/19  2:41 PM   Specimen: Nasopharyngeal Swab  Result Value Ref Range Status   SARS Coronavirus 2 NEGATIVE NEGATIVE Final    Comment: (NOTE) SARS-CoV-2 target nucleic acids are NOT DETECTED.  The SARS-CoV-2 RNA is generally detectable in upper and lower respiratory specimens during the acute phase of infection. The lowest concentration of SARS-CoV-2 viral copies this assay can detect is 250 copies / mL. A negative result does not preclude SARS-CoV-2 infection and should not be used as the sole basis for treatment or other patient management decisions.  A negative result may occur with improper specimen collection / handling, submission of specimen other than nasopharyngeal swab, presence of viral mutation(s) within the areas targeted by this assay, and inadequate number of viral copies (<250 copies / mL). A negative result must be combined with clinical observations, patient history, and epidemiological information.  Fact Sheet for Patients:   BoilerBrush.com.cy  Fact Sheet for Healthcare Providers: https://pope.com/  This test is not yet approved or  cleared by the [place] FDA and has been authorized for detection and/or diagnosis of SARS-CoV-2 by FDA under an Emergency Use Authorization (EUA).  This EUA will remain in effect (meaning this test can be used) for the duration of the COVID-19 declaration under Section 564(b)(1) of the Act, 21 U.S.C. section 360bbb-3(b)(1), unless the authorization is terminated or revoked sooner.  Performed at [facility], [address], [place], [place] [address]   Culture, blood (Routine X 2) w Reflex to ID Panel     Status: Abnormal   Collection Time: 11/11/19  2:55 PM   Specimen: BLOOD  Result Value Ref Range Status   Specimen Description   Final    BLOOD RIGHT ANTECUBITAL Performed at [facility] [address]  [place], [place] [address]    Special Requests  Final    BOTTLES DRAWN AEROBIC AND ANAEROBIC Blood Culture adequate volume Performed at [facility], [address], [place], [place] [address]    Culture  Setup Time   Final    GRAM POSITIVE RODS AEROBIC BOTTLE ONLY CRITICAL RESULT CALLED TO, READ BACK BY AND VERIFIED WITH: PHARMD NICK  GLOGOVAC [id number] FCP     Culture (A)  Final    BACILLUS SPECIES Standardized susceptibility testing for this organism is not available. Performed at [facility], [address], [place], [place] [address]    Report Status 11/13/2019 FINAL  Final      Radiology Studies: No results found.     LOS: 1 day   Michell Giuliano [employer] on [email or web address]  11/13/2019, 12:45 PM

## 2019-11-13 NOTE — Progress Notes (Signed)
   11/13/19 1336  Assess: MEWS Score  Temp 98.1 F (36.7 C)  BP 112/84  Pulse Rate (!) 119  Resp 17  SpO2 99 %  Assess: MEWS Score  MEWS Temp 0  MEWS Systolic 0  MEWS Pulse 2  MEWS RR 0  MEWS LOC 0  MEWS Score 2  MEWS Score Color Yellow  Assess: if the MEWS score is Yellow or Red  Were vital signs taken at a resting state? Yes  Focused Assessment Documented focused assessment  Early Detection of Sepsis Score *See Row Information* Low  MEWS guidelines implemented *See Row Information* Yes  Treat  MEWS Interventions Administered scheduled meds/treatments  Take Vital Signs  Increase Vital Sign Frequency  Yellow: Q 2hr X 2 then Q 4hr X 2, if remains yellow, continue Q 4hrs  Escalate  MEWS: Escalate Yellow: discuss with charge nurse/RN and consider discussing with provider and RRT  Notify: Provider  Provider Name/Title Osvaldo Shipper MD  Date Provider Notified 11/13/19  Time Provider Notified 1450  Notification Type Page  Notification Reason Change in status (MEWS yellow initiated due to tachycardia)  Response See new orders  Date of Provider Response 11/13/19  Time of Provider Response 1452  Document  Patient Outcome Stabilized after interventions  Progress note created (see row info) Yes   MD contacted regarding change in status.   New orders for CiWA assessments initiated.

## 2019-11-14 DIAGNOSIS — E876 Hypokalemia: Secondary | ICD-10-CM

## 2019-11-14 DIAGNOSIS — F10231 Alcohol dependence with withdrawal delirium: Secondary | ICD-10-CM

## 2019-11-14 LAB — CBC
HCT: 26 % — ABNORMAL LOW (ref 39.0–52.0)
Hemoglobin: 8.9 g/dL — ABNORMAL LOW (ref 13.0–17.0)
MCH: 33.5 pg (ref 26.0–34.0)
MCHC: 34.2 g/dL (ref 30.0–36.0)
MCV: 97.7 fL (ref 80.0–100.0)
Platelets: 196 10*3/uL (ref 150–400)
RBC: 2.66 MIL/uL — ABNORMAL LOW (ref 4.22–5.81)
RDW: 12.9 % (ref 11.5–15.5)
WBC: 10.8 10*3/uL — ABNORMAL HIGH (ref 4.0–10.5)
nRBC: 0 % (ref 0.0–0.2)

## 2019-11-14 LAB — TSH: TSH: 3.564 u[IU]/mL (ref 0.350–4.500)

## 2019-11-14 LAB — COMPREHENSIVE METABOLIC PANEL
ALT: 23 U/L (ref 0–44)
AST: 28 U/L (ref 15–41)
Albumin: 2.1 g/dL — ABNORMAL LOW (ref 3.5–5.0)
Alkaline Phosphatase: 46 U/L (ref 38–126)
Anion gap: 9 (ref 5–15)
BUN: 5 mg/dL — ABNORMAL LOW (ref 6–20)
CO2: 25 mmol/L (ref 22–32)
Calcium: 8.3 mg/dL — ABNORMAL LOW (ref 8.9–10.3)
Chloride: 99 mmol/L (ref 98–111)
Creatinine, Ser: 0.43 mg/dL — ABNORMAL LOW (ref 0.61–1.24)
GFR calc Af Amer: >60 mL/min (ref >60)
GFR calc non Af Amer: >60 mL/min (ref >60)
Glucose, Bld: 100 mg/dL — ABNORMAL HIGH (ref 70–99)
Potassium: 2.5 mmol/L — CL (ref 3.5–5.1)
Sodium: 133 mmol/L — ABNORMAL LOW (ref 135–145)
Total Bilirubin: 0.8 mg/dL (ref 0.3–1.2)
Total Protein: 6.8 g/dL (ref 6.5–8.1)

## 2019-11-14 LAB — FOLATE: Folate: 6.1 ng/mL (ref >5.9)

## 2019-11-14 LAB — POTASSIUM: Potassium: 3.5 mmol/L (ref 3.5–5.1)

## 2019-11-14 LAB — IRON AND TIBC
Iron: 18 ug/dL — ABNORMAL LOW (ref 45–182)
Saturation Ratios: 15 % — ABNORMAL LOW (ref 17.9–39.5)
TIBC: 121 ug/dL — ABNORMAL LOW (ref 250–450)
UIBC: 103 ug/dL

## 2019-11-14 LAB — RETICULOCYTES
Immature Retic Fract: 16.5 % — ABNORMAL HIGH (ref 2.3–15.9)
RBC.: 2.67 MIL/uL — ABNORMAL LOW (ref 4.22–5.81)
Retic Count, Absolute: 32.8 10*3/uL (ref 19.0–186.0)
Retic Ct Pct: 1.2 % (ref 0.4–3.1)

## 2019-11-14 LAB — FERRITIN: Ferritin: 1368 ng/mL — ABNORMAL HIGH (ref 24–336)

## 2019-11-14 LAB — MAGNESIUM: Magnesium: 1.5 mg/dL — ABNORMAL LOW (ref 1.7–2.4)

## 2019-11-14 LAB — RPR: RPR Ser Ql: NONREACTIVE

## 2019-11-14 MED ORDER — CYANOCOBALAMIN 1000 MCG/ML IJ SOLN
1000.0000 ug | Freq: Once | INTRAMUSCULAR | Status: AC
  Administered 2019-11-14: 1000 ug via INTRAMUSCULAR
  Filled 2019-11-14: qty 1

## 2019-11-14 MED ORDER — LOPERAMIDE HCL 1 MG/7.5ML PO SUSP
4.0000 mg | Freq: Once | ORAL | Status: AC
  Administered 2019-11-14: 4 mg via ORAL
  Filled 2019-11-14: qty 30

## 2019-11-14 MED ORDER — POTASSIUM CHLORIDE CRYS ER 20 MEQ PO TBCR: 40.0000 meq | EXTENDED_RELEASE_TABLET | Freq: Two times a day (BID) | ORAL | Status: DC

## 2019-11-14 MED ORDER — VITAMIN B-12 1000 MCG PO TABS
1000.0000 ug | ORAL_TABLET | Freq: Every day | ORAL | Status: DC
  Administered 2019-11-16 – 2019-11-26 (×11): 1000 ug via ORAL
  Filled 2019-11-14 (×11): qty 1

## 2019-11-14 MED ORDER — POTASSIUM CHLORIDE IN NACL 20-0.9 MEQ/L-% IV SOLN
INTRAVENOUS | Status: AC
  Filled 2019-11-14 (×4): qty 1000

## 2019-11-14 MED ORDER — POTASSIUM CHLORIDE 10 MEQ/100ML IV SOLN: 10.0000 meq | INTRAVENOUS | Status: DC

## 2019-11-14 MED ORDER — POTASSIUM CHLORIDE 10 MEQ/100ML IV SOLN
10.0000 meq | INTRAVENOUS | Status: AC
  Administered 2019-11-14 (×4): 10 meq via INTRAVENOUS
  Filled 2019-11-14 (×4): qty 100

## 2019-11-14 MED ORDER — MAGNESIUM SULFATE 4 GM/100ML IV SOLN
4.0000 g | Freq: Once | INTRAVENOUS | Status: AC
  Administered 2019-11-14: 4 g via INTRAVENOUS
  Filled 2019-11-14: qty 100

## 2019-11-14 MED ORDER — POTASSIUM CHLORIDE CRYS ER 20 MEQ PO TBCR: 40.0000 meq | EXTENDED_RELEASE_TABLET | ORAL | Status: DC

## 2019-11-14 MED ORDER — POTASSIUM CHLORIDE 10 MEQ/100ML IV SOLN
10.0000 meq | INTRAVENOUS | Status: AC
  Administered 2019-11-14 (×4): 10 meq via INTRAVENOUS
  Filled 2019-11-14 (×5): qty 100

## 2019-11-14 NOTE — Progress Notes (Signed)
[facility] PROGRESS NOTE   Randall Garcia JOA:[id number] DOB: 1965-10-19 DOA: 11/11/2019  PCP: Patient, No Pcp Per  Brief History/Interval Summary: 54 y.o. male with no known past medical history (never sees doctors) who was brought to the ED via EMS after church members found patient minimally responsive, on his couch in his own urine and feces where he had apparently not moved from for at least 2 weeks.    Patient was nonverbal at the time of initial presentation.  Apparently he lives with his brother who also is in a similar situation.  Apparently drinks a lot of alcohol.  Most of the history was provided by patient's pastor.    Reason for Visit: Acute metabolic encephalopathy  Consultants: Psychiatry  Procedures: None  Antibiotics: Anti-infectives (From admission, onward)   None      Subjective/Interval History: Yesterday evening patient started becoming agitated and was noted to be tachycardic.  He was thought to be going through alcohol withdrawal.  Was started on CIWA protocol.  Somnolent this morning but easily arousable.    Assessment/Plan:  Acute metabolic encephalopathy/catatonia/Wernicke's This was present on admission.  Patient did not have any focal deficits.  CT head was negative for acute findings.  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  Patient was nonverbal in the emergency room.   He did start speaking but noted to be very confused.  All of this appears to be secondary to Wernicke's.  Apparently drinks alcohol on a daily basis.  He likely also has nutritional deficiencies on top of that.  He was noted to have nystagmus. He was started on high-dose thiamine.  His B12 level was also low normal.  Folate level is 6.1.  TSH is normal.  RPR is nonreactive.  HIV is nonreactive.  Mild rhabdomyolysis CK was noted to be 557 and started on IV fluids.  Subsequently CK levels were noted to be normal.  Renal function is normal.    Hypokalemia and hypomagnesemia Potassium  level noted to be 2.5 today.  Magnesium is 1.5.  He will be given additional doses of potassium and magnesium today.  Recheck tomorrow.    Moderate Protein calorie malnutrition Due to chronic alcoholism as well as poor oral intake.   Bacteremia, likely contaminant Bacillus species noted in 1 set.  He is afebrile.  His WBC is normal.  Likely a contaminant.  Continue to monitor for now.  No antibiotics at this time.  Normocytic anemia Drop in hemoglobin is noted.  Most likely dilutional.  Anemia panel reviewed.  Ferritin is 1368.  TIBC 121.  No evidence of overt bleeding.  Continue to monitor.    Vitamin B12 deficiency B12 level noted to be low normal at 264.  Started on supplementation.  Dehydration Receiving IV fluids.  Alcohol withdrawal syndrome in the setting of alcohol abuse Patient started developing withdrawal symptoms yesterday evening.  Started on CIWA protocol.  Seems to be calm this morning.  Continue to monitor.  Tobacco abuse Nicotine patch  Pressure injury Pressure Injury 11/11/19 Hip Right Unstageable - Full thickness tissue loss in which the base of the injury is covered by slough (yellow, tan, gray, green or brown) and/or eschar (tan, brown or black) in the wound bed. (Active)  11/11/19 1815  Location: Hip  Location Orientation: Right  Staging: Unstageable - Full thickness tissue loss in which the base of the injury is covered by slough (yellow, tan, gray, green or brown) and/or eschar (tan, brown or black) in the wound bed.  Wound Description (Comments):   Present on Admission: Yes     Pressure Injury 11/11/19 Other (Comment) Left;Lateral Stage 2 -  Partial thickness loss of dermis presenting as a shallow open injury with a red, pink wound bed without slough. (Active)  11/11/19 1815  Location: Other (Comment)  Location Orientation: Left;Lateral  Staging: Stage 2 -  Partial thickness loss of dermis presenting as a shallow open injury with a red, pink wound bed  without slough.  Wound Description (Comments):   Present on Admission: Yes     DVT Prophylaxis: Lovenox Code Status: Full code Family Communication: No family at bedside Disposition Plan:  Status is: Inpatient  Remains inpatient appropriate because:Altered mental status, Unsafe d/c plan and IV treatments appropriate due to intensity of illness or inability to take PO   Dispo:  Patient From: Home  Planned Disposition: To be determined  Expected discharge date: 11/18/19  Medically stable for discharge: No        Medications:  Scheduled:  enoxaparin (LOVENOX) injection  40 mg Subcutaneous Q24H   feeding supplement (ENSURE ENLIVE)  237 mL Oral BID BM   folic acid  1 mg Intravenous Daily   LORazepam  0-4 mg Oral Q6H   Followed by   Melene Muller ON 11/15/2019] LORazepam  0-4 mg Oral Q12H   multivitamin with minerals  1 tablet Oral Daily   nicotine  14 mg Transdermal Daily   potassium chloride  40 mEq Oral Q4H   QUEtiapine  25 mg Oral BID   [START ON 11/20/2019] thiamine  100 mg Oral Daily   vitamin B-12  1,000 mcg Oral Daily   Continuous:  0.9 % NaCl with KCl 20 mEq / L 75 mL/hr at 11/14/19 0950   potassium chloride 10 mEq (11/14/19 1000)   [START ON 11/15/2019] thiamine injection     WEX:[id number] **OR** acetaminophen, bisacodyl, hydrALAZINE, LORazepam **OR** LORazepam, ondansetron **OR** ondansetron (ZOFRAN) IV, senna-docusate   Objective:  Vital Signs  Vitals:   11/13/19 1542 11/13/19 1748 11/13/19 2134 11/14/19 0138  BP: 107/81 127/87 101/76 126/78  Pulse: (!) 110 (!) 109 (!) 113 (!) 124  Resp:  18 18 18   Temp: 99.2 F (37.3 C) 97.7 F (36.5 C)    TempSrc: Oral Oral    SpO2: 99% (!) 82% 100% 100%  Weight:      Height:        Intake/Output Summary (Last 24 hours) at 11/14/2019 1049 Last data filed at 11/14/2019 0629 Gross per 24 hour  Intake --  Output 651 ml  Net -651 ml   Filed Weights   11/11/19 1957  Weight: 54.1 kg   General  appearance: Somnolent but easily arousable.  Remains confused and distracted.  In no distress Resp: Clear to auscultation bilaterally.  Normal effort Cardio: S1-S2 is normal regular.  No S3-S4.  No rubs murmurs or bruit GI: Abdomen is soft.  Nontender nondistended.  Bowel sounds are present normal.  No masses organomegaly Extremities: No edema.  Full range of motion of lower extremities. Neurologic: No obvious focal neurological deficits noted.  Disoriented       Lab Results:  Data Reviewed: I have personally reviewed following labs and imaging studies  CBC: Recent Labs  Lab 11/11/19 1144 11/12/19 1229 11/13/19 0722 11/14/19 0555  WBC 10.2 10.0 10.0 10.8*  HGB 12.5* 12.5* 10.7* 8.9*  HCT 37.8* 36.9* 32.1* 26.0*  MCV 99.7 97.6 98.5 97.7  PLT 207 166 212 196    Basic Metabolic Panel: Recent Labs  Lab 11/11/19 1144 11/11/19 1941 11/12/19 1229 11/13/19 0722 11/14/19 0555  NA 134*  --  134* 134* 133*  K 3.7  --  4.9 4.1 2.5*  CL 99  --  102 97* 99  CO2 24  --  21* 27 25  GLUCOSE 106*  --  100* 102* 100*  BUN 15  --  7 7 5*  CREATININE 0.69  --  0.50* 0.53* 0.43*  CALCIUM 9.2  --  8.7* 8.7* 8.3*  MG 1.5* 1.7 1.6* 1.7 1.5*    GFR: Estimated Creatinine Clearance: 80.8 mL/min (A) (by C-G formula based on SCr of 0.43 mg/dL (L)).  Liver Function Tests: Recent Labs  Lab 11/11/19 1144 11/12/19 1229 11/13/19 0722 11/14/19 0555  AST 44* 43* 35 28  ALT 34 26 29 23   ALKPHOS 62 52 53 46  BILITOT 1.2 0.9 0.9 0.8  PROT 8.4* 6.9 7.3 6.8  ALBUMIN 2.8* 2.2* 2.4* 2.1*     Recent Labs  Lab 11/11/19 1941  AMMONIA <9*    Coagulation Profile: Recent Labs  Lab 11/11/19 1144  INR 1.0    Cardiac Enzymes: Recent Labs  Lab 11/11/19 1144 11/12/19 1229 11/13/19 0722  CKTOTAL 557* 299 141    Anemia Panel: Recent Labs    11/11/19 1941 11/14/19 0555  VITAMINB12 264  --   FOLATE  --  6.1  FERRITIN  --  1,368*  TIBC  --  121*  IRON  --  18*  RETICCTPCT  --   1.2    Recent Results (from the past 240 hour(s))  Culture, blood (Routine X 2) w Reflex to ID Panel     Status: None (Preliminary result)   Collection Time: 11/11/19 11:44 AM   Specimen: BLOOD  Result Value Ref Range Status   Specimen Description   Final    BLOOD LEFT ANTECUBITAL Performed at [facility], [address], [place], [place]    Special Requests   Final    BOTTLES DRAWN AEROBIC AND ANAEROBIC Blood Culture adequate volume Performed at [facility], [address], [place], [place]    Culture   Final    NO GROWTH 3 DAYS Performed at [facility], [address] [place], [place]    Report Status PENDING  Incomplete  SARS Coronavirus 2 by RT PCR (hospital order, performed in [facility] hospital lab) Nasopharyngeal Nasopharyngeal Swab     Status: None   Collection Time: 11/11/19  2:41 PM   Specimen: Nasopharyngeal Swab  Result Value Ref Range Status   SARS Coronavirus 2 NEGATIVE NEGATIVE Final    Comment: (NOTE) SARS-CoV-2 target nucleic acids are NOT DETECTED.  The SARS-CoV-2 RNA is generally detectable in upper and lower respiratory specimens during the acute phase of infection. The lowest concentration of SARS-CoV-2 viral copies this assay can detect is 250 copies / mL. A negative result does not preclude SARS-CoV-2 infection and should not be used as the sole basis for treatment or other patient management decisions.  A negative result may occur with improper specimen collection / handling, submission of specimen other than nasopharyngeal swab, presence of viral mutation(s) within the areas targeted by this assay, and inadequate number of viral copies (<250 copies / mL). A negative result must be combined with clinical observations, patient history, and epidemiological information.  Fact Sheet for Patients:   11/13/19  Fact Sheet for Healthcare  Providers: BoilerBrush.com.cy  This test is not yet approved or  cleared by the [place] and has been authorized for detection and/or diagnosis of SARS-CoV-2 by FDA under an Emergency Use Authorization (EUA).  This EUA will remain in effect (meaning this test can be used) for the duration of the COVID-19 declaration under Section 564(b)(1) of the Act, 21 U.S.C. section 360bbb-3(b)(1), unless the authorization is terminated or revoked sooner.  Performed at [facility], [place] [address], [place], [place] [address]   Culture, blood (Routine X 2) w Reflex to ID Panel     Status: Abnormal   Collection Time: 11/11/19  2:55 PM   Specimen: BLOOD  Result Value Ref Range Status   Specimen Description   Final    BLOOD RIGHT ANTECUBITAL Performed at [facility] [address], [place], [place] [address]    Special Requests   Final    BOTTLES DRAWN AEROBIC AND ANAEROBIC Blood Culture adequate volume Performed at [facility] [address], [place], [place] [address]    Culture  Setup Time   Final    GRAM POSITIVE RODS AEROBIC BOTTLE ONLY CRITICAL RESULT CALLED TO, READ BACK BY AND VERIFIED WITH: PHARMD Latta [id number] FCP     Culture (A)  Final    BACILLUS SPECIES Standardized susceptibility testing for this organism is not available. Performed at [facility], [place] [address], [place], [place] [address]    Report Status 11/13/2019 FINAL  Final      Radiology Studies: No results found.     LOS: 2 days   [facility] Pager on [email or web address]  11/14/2019, 10:49 AM

## 2019-11-14 NOTE — Progress Notes (Signed)
[  T HAS CRITICAL K+ LEVEL OF 2.5. TR[facility] M.DENNY PAGED AND NOTIFIED OF SAME

## 2019-11-15 LAB — BASIC METABOLIC PANEL
Anion gap: 6 (ref 5–15)
BUN: 6 mg/dL (ref 6–20)
CO2: 27 mmol/L (ref 22–32)
Calcium: 8.4 mg/dL — ABNORMAL LOW (ref 8.9–10.3)
Chloride: 104 mmol/L (ref 98–111)
Creatinine, Ser: 0.51 mg/dL — ABNORMAL LOW (ref 0.61–1.24)
GFR calc Af Amer: >60 mL/min (ref >60)
GFR calc non Af Amer: >60 mL/min (ref >60)
Glucose, Bld: 86 mg/dL (ref 70–99)
Potassium: 3.7 mmol/L (ref 3.5–5.1)
Sodium: 137 mmol/L (ref 135–145)

## 2019-11-15 LAB — CBC
HCT: 27.3 % — ABNORMAL LOW (ref 39.0–52.0)
Hemoglobin: 8.9 g/dL — ABNORMAL LOW (ref 13.0–17.0)
MCH: 33.1 pg (ref 26.0–34.0)
MCHC: 32.6 g/dL (ref 30.0–36.0)
MCV: 101.5 fL — ABNORMAL HIGH (ref 80.0–100.0)
Platelets: 210 10*3/uL (ref 150–400)
RBC: 2.69 MIL/uL — ABNORMAL LOW (ref 4.22–5.81)
RDW: 13.1 % (ref 11.5–15.5)
WBC: 5.6 10*3/uL (ref 4.0–10.5)
nRBC: 0 % (ref 0.0–0.2)

## 2019-11-15 LAB — MAGNESIUM: Magnesium: 1.9 mg/dL (ref 1.7–2.4)

## 2019-11-15 MED ORDER — POTASSIUM CHLORIDE CRYS ER 20 MEQ PO TBCR
40.0000 meq | EXTENDED_RELEASE_TABLET | Freq: Once | ORAL | Status: AC
  Administered 2019-11-15: 40 meq via ORAL
  Filled 2019-11-15: qty 2

## 2019-11-15 MED ORDER — MAGNESIUM SULFATE 2 GM/50ML IV SOLN
2.0000 g | Freq: Once | INTRAVENOUS | Status: AC
  Administered 2019-11-15: 2 g via INTRAVENOUS
  Filled 2019-11-15: qty 50

## 2019-11-15 NOTE — Plan of Care (Signed)
Pt VS WNL this am.  No complaints at this time.   Problem: Clinical Measurements: Goal: Ability to maintain clinical measurements within normal limits will improve Outcome: Progressing Goal: Will remain free from infection Outcome: Progressing Goal: Diagnostic test results will improve Outcome: Progressing Goal: Respiratory complications will improve Outcome: Progressing Goal: Cardiovascular complication will be avoided Outcome: Progressing   Problem: Coping: Goal: Level of anxiety will decrease Outcome: Progressing   Problem: Elimination: Goal: Will not experience complications related to bowel motility Outcome: Progressing Goal: Will not experience complications related to urinary retention Outcome: Progressing   Problem: Pain Managment: Goal: General experience of comfort will improve Outcome: Progressing   Problem: Safety: Goal: Ability to remain free from injury will improve Outcome: Progressing   Problem: Skin Integrity: Goal: Risk for impaired skin integrity will decrease Outcome: Progressing   Problem: Education: Goal: Knowledge of General Education information will improve Description: Including pain rating scale, medication(s)/side effects and non-pharmacologic comfort measures Outcome: Not Progressing   Problem: Health Behavior/Discharge Planning: Goal: Ability to manage health-related needs will improve Outcome: Not Progressing   Problem: Activity: Goal: Risk for activity intolerance will decrease Outcome: Not Progressing   Problem: Nutrition: Goal: Adequate nutrition will be maintained Outcome: Not Progressing

## 2019-11-15 NOTE — Progress Notes (Signed)
[facility] PROGRESS NOTE   Randall Garcia FMB:[id number] DOB: 05/27/1965 DOA: 11/11/2019  PCP: Patient, No Pcp Per  Brief History/Interval Summary: 54 y.o. male with no known past medical history (never sees doctors) who was brought to the ED via EMS after church members found patient minimally responsive, on his couch in his own urine and feces where he had apparently not moved from for at least 2 weeks.    Patient was nonverbal at the time of initial presentation.  Apparently he lives with his brother who also is in a similar situation.  Apparently drinks a lot of alcohol.  Most of the history was provided by patient's pastor.    Reason for Visit: Acute metabolic encephalopathy.  Alcohol withdrawal syndrome  Consultants: Psychiatry  Procedures: None  Antibiotics: Anti-infectives (From admission, onward)   None      Subjective/Interval History: Patient noted to be much more responsive today compared to yesterday.  He denies any pain.  He had three loose stools yesterday afternoon but none since then.      Assessment/Plan:  Acute metabolic encephalopathy/catatonia/Wernicke's Patient did not have any focal deficits.  CT head was negative for acute findings.  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  Patient was nonverbal in the emergency room.   He did start speaking but noted to be very confused.  All of this appears to be secondary to Wernicke's.  Apparently drinks alcohol on a daily basis.  He likely also has nutritional deficiencies on top of that.  He was noted to have nystagmus. He was started on high-dose thiamine.  His B12 level was also low normal.  Folate level is 6.1.  TSH is normal.  RPR is nonreactive.  HIV is nonreactive. Continues to be nonfocal on examination.  Mentation seems to be slightly better today though he remains distracted.  Follows commands though.  Alcohol withdrawal syndrome in the setting of alcohol abuse Remains on CIWA protocol.  Tachycardia appears to  have improved.  Remains delirious.  Continue to monitor.  Yesterday's episodes of loose stool could have been due to the withdrawal process.  Resolved with Imodium.  Abdomen is benign.  Mild rhabdomyolysis CK was noted to be 557 and started on IV fluids.  Subsequently CK levels were noted to be normal.  Renal function is normal.    Hypokalemia and hypomagnesemia Aggressively repleted yesterday.  Noted to be normal today.  Magnesium is 1.9.  Additional dose of potassium and magnesium today.     Moderate Protein calorie malnutrition Due to chronic alcoholism as well as poor oral intake.   Bacteremia, likely contaminant Bacillus species noted in 1 set.  He is afebrile.  His WBC is normal.  Likely a contaminant.  Continue to monitor for now.  No antibiotics at this time.  Normocytic anemia Drop in hemoglobin is noted.  Most likely dilutional.  Anemia panel reviewed.  Ferritin is 1368.  TIBC 121.  No evidence of overt bleeding.  Continue to monitor.    Vitamin B12 deficiency B12 level noted to be low normal at 264.  Started on supplementation.  Dehydration Receiving IV fluids.  Tobacco abuse Nicotine patch  Pressure injury Pressure Injury 11/11/19 Hip Right Unstageable - Full thickness tissue loss in which the base of the injury is covered by slough (yellow, tan, gray, green or brown) and/or eschar (tan, brown or black) in the wound bed. (Active)  11/11/19 1815  Location: Hip  Location Orientation: Right  Staging: Unstageable - Full thickness tissue loss in  which the base of the injury is covered by slough (yellow, tan, gray, green or brown) and/or eschar (tan, brown or black) in the wound bed.  Wound Description (Comments):   Present on Admission: Yes     Pressure Injury 11/11/19 Other (Comment) Left;Lateral Stage 2 -  Partial thickness loss of dermis presenting as a shallow open injury with a red, pink wound bed without slough. (Active)  11/11/19 1815  Location: Other (Comment)    Location Orientation: Left;Lateral  Staging: Stage 2 -  Partial thickness loss of dermis presenting as a shallow open injury with a red, pink wound bed without slough.  Wound Description (Comments):   Present on Admission: Yes     DVT Prophylaxis: Lovenox Code Status: Full code Family Communication: No family at bedside Disposition Plan:  Status is: Inpatient  Remains inpatient appropriate because:Altered mental status, Unsafe d/c plan and IV treatments appropriate due to intensity of illness or inability to take PO   Dispo:  Patient From: Home  Planned Disposition: To be determined  Expected discharge date: 11/18/19  Medically stable for discharge: No        Medications:  Scheduled: . enoxaparin (LOVENOX) injection  40 mg Subcutaneous Q24H  . feeding supplement (ENSURE ENLIVE)  237 mL Oral BID BM  . folic acid  1 mg Intravenous Daily  . LORazepam  0-4 mg Oral Q6H   Followed by  . LORazepam  0-4 mg Oral Q12H  . multivitamin with minerals  1 tablet Oral Daily  . nicotine  14 mg Transdermal Daily  . QUEtiapine  25 mg Oral BID  . [START ON 11/20/2019] thiamine  100 mg Oral Daily  . [START ON 11/16/2019] vitamin B-12  1,000 mcg Oral Daily   Continuous: . 0.9 % NaCl with KCl 20 mEq / L 75 mL/hr at 11/14/19 0950  . thiamine injection 250 mg (11/15/19 0624)   JSE:[id number] **OR** acetaminophen, bisacodyl, hydrALAZINE, LORazepam **OR** LORazepam, ondansetron **OR** ondansetron (ZOFRAN) IV, senna-docusate   Objective:  Vital Signs  Vitals:   11/14/19 0138 11/14/19 1331 11/14/19 2042 11/15/19 0531  BP: 126/78 120/84 121/80 125/79  Pulse: (!) 124 99 (!) 102 100  Resp: 18 15 14 14   Temp:  99 F (37.2 C) 98.9 F (37.2 C) 97.7 F (36.5 C)  TempSrc:  Oral Oral Oral  SpO2: 100% 97% 98% 99%  Weight:      Height:        Intake/Output Summary (Last 24 hours) at 11/15/2019 1159 Last data filed at 11/15/2019 1000 Gross per 24 hour  Intake 50 ml  Output 1500 ml   Net -1450 ml   Filed Weights   11/11/19 1957  Weight: 54.1 kg    General appearance: More awake and alert compared to yesterday.  Remains delirious. Resp: Clear to auscultation bilaterally.  Normal effort Cardio: S1-S2 is normal regular.  No S3-S4.  No rubs murmurs or bruit GI: Abdomen is soft.  Nontender nondistended.  Bowel sounds are present normal.  No masses organomegaly Extremities: No edema.  Full range of motion of lower extremities. Neurologic: No obvious focal neurological deficits appreciated.    Lab Results:  Data Reviewed: I have personally reviewed following labs and imaging studies  CBC: Recent Labs  Lab 11/11/19 1144 11/12/19 1229 11/13/19 0722 11/14/19 0555 11/15/19 0554  WBC 10.2 10.0 10.0 10.8* 5.6  HGB 12.5* 12.5* 10.7* 8.9* 8.9*  HCT 37.8* 36.9* 32.1* 26.0* 27.3*  MCV 99.7 97.6 98.5 97.7 101.5*  PLT 207 166  212 196 210    Basic Metabolic Panel: Recent Labs  Lab 11/11/19 1144 11/11/19 1144 11/11/19 1941 11/12/19 1229 11/13/19 0722 11/14/19 0555 11/14/19 1937 11/15/19 0554  NA 134*  --   --  134* 134* 133*  --  137  K 3.7   < >  --  4.9 4.1 2.5* 3.5 3.7  CL 99  --   --  102 97* 99  --  104  CO2 24  --   --  21* 27 25  --  27  GLUCOSE 106*  --   --  100* 102* 100*  --  86  BUN 15  --   --  7 7 5*  --  6  CREATININE 0.69  --   --  0.50* 0.53* 0.43*  --  0.51*  CALCIUM 9.2  --   --  8.7* 8.7* 8.3*  --  8.4*  MG 1.5*   < > 1.7 1.6* 1.7 1.5*  --  1.9   < > = values in this interval not displayed.    GFR: Estimated Creatinine Clearance: 80.8 mL/min (A) (by C-G formula based on SCr of 0.51 mg/dL (L)).  Liver Function Tests: Recent Labs  Lab 11/11/19 1144 11/12/19 1229 11/13/19 0722 11/14/19 0555  AST 44* 43* 35 28  ALT 34 26 29 23   ALKPHOS 62 52 53 46  BILITOT 1.2 0.9 0.9 0.8  PROT 8.4* 6.9 7.3 6.8  ALBUMIN 2.8* 2.2* 2.4* 2.1*     Recent Labs  Lab 11/11/19 1941  AMMONIA <9*    Coagulation Profile: Recent Labs  Lab  11/11/19 1144  INR 1.0    Cardiac Enzymes: Recent Labs  Lab 11/11/19 1144 11/12/19 1229 11/13/19 0722  CKTOTAL 557* 299 141    Anemia Panel: Recent Labs    11/14/19 0555  FOLATE 6.1  FERRITIN 1,368*  TIBC 121*  IRON 18*  RETICCTPCT 1.2    Recent Results (from the past 240 hour(s))  Culture, blood (Routine X 2) w Reflex to ID Panel     Status: None (Preliminary result)   Collection Time: 11/11/19 11:44 AM   Specimen: BLOOD  Result Value Ref Range Status   Specimen Description   Final    BLOOD LEFT ANTECUBITAL Performed at [facility], [address], [place], [place]    Special Requests   Final    BOTTLES DRAWN AEROBIC AND ANAEROBIC Blood Culture adequate volume Performed at [facility], [address], [place], [place]    Culture   Final    NO GROWTH 3 DAYS Performed at [facility], [address], [place], [place]    Report Status PENDING  Incomplete  SARS Coronavirus 2 by RT PCR (hospital order, performed in [facility] hospital lab) Nasopharyngeal Nasopharyngeal Swab     Status: None   Collection Time: 11/11/19  2:41 PM   Specimen: Nasopharyngeal Swab  Result Value Ref Range Status   SARS Coronavirus 2 NEGATIVE NEGATIVE Final    Comment: (NOTE) SARS-CoV-2 target nucleic acids are NOT DETECTED.  The SARS-CoV-2 RNA is generally detectable in upper and lower respiratory specimens during the acute phase of infection. The lowest concentration of SARS-CoV-2 viral copies this assay can detect is 250 copies / mL. A negative result does not preclude SARS-CoV-2 infection and should not be used as the sole basis for treatment or other patient management decisions.  A negative result may occur with improper specimen  collection / handling, submission of specimen other than nasopharyngeal swab, presence of viral mutation(s) within the areas targeted by this assay, and inadequate number of  viral copies (<250 copies / mL). A negative result must be combined with clinical observations, patient history, and epidemiological information.  Fact Sheet for Patients:   StrictlyIdeas.no  Fact Sheet for Healthcare Providers: BankingDealers.co.za  This test is not yet approved or  cleared by the [place] FDA and has been authorized for detection and/or diagnosis of SARS-CoV-2 by FDA under an Emergency Use Authorization (EUA).  This EUA will remain in effect (meaning this test can be used) for the duration of the COVID-19 declaration under Section 564(b)(1) of the Act, 21 U.S.C. section 360bbb-3(b)(1), unless the authorization is terminated or revoked sooner.  Performed at [facility], [place] [address], [place], [place] [address]   Culture, blood (Routine X 2) w Reflex to ID Panel     Status: Abnormal   Collection Time: 11/11/19  2:55 PM   Specimen: BLOOD  Result Value Ref Range Status   Specimen Description   Final    BLOOD RIGHT ANTECUBITAL Performed at [place] [address], [place], [place] [address]    Special Requests   Final    BOTTLES DRAWN AEROBIC AND ANAEROBIC Blood Culture adequate volume Performed at [place] [address] [place], [place] [address]    Culture  Setup Time   Final    GRAM POSITIVE RODS AEROBIC BOTTLE ONLY CRITICAL RESULT CALLED TO, READ BACK BY AND VERIFIED WITH: PHARMD [place] [id number] FCP     Culture (A)  Final    BACILLUS SPECIES Standardized susceptibility testing for this organism is not available. Performed at [facility], [place] [address] [place], [place] [address]    Report Status 11/13/2019 FINAL  Final      Radiology Studies: No results found.     LOS: 3 days   Randall Garcia [employer] on [email or web address]  11/15/2019, 11:59 AM

## 2019-11-15 NOTE — Progress Notes (Signed)
Physical Therapy Treatment Patient Details Name: Randall Garcia MRN: [id number] DOB: [birth year] Today's Date: 11/15/2019    History of Present Illness 54 y.o. male with no known past medical history (never sees doctors) who was brought to the ED via EMS after church members found patient minimally responsive, on his couch in his own urine and feces where he had apparently not moved from for at least 2 weeks. Diagnosis: acute met encephalopahty, rhabdomyolysis.    PT Comments    Pt assisted with standing and ambulating.  Pt with decreased balance and requiring assist to correct.  Continue to recommend SNF upon d/c.    Follow Up Recommendations  SNF;Supervision/Assistance - 24 hour     Equipment Recommendations  Rolling walker with 5" wheels    Recommendations for Other Services       Precautions / Restrictions Precautions Precautions: Fall    Mobility  Bed Mobility               General bed mobility comments: pt in recliner  Transfers Overall transfer level: Needs assistance Equipment used: Rolling walker (2 wheeled) Transfers: Sit to/from Stand Sit to Stand: Mod assist;+2 physical assistance;+2 safety/equipment         General transfer comment: multimodal cues for safe technique, assist to rise, steady and control descent, pt stood by chair for 4 minutes (stated he was unable to march in place) however took seated rest break and able to stand and then ambulate  Ambulation/Gait Ambulation/Gait assistance: Mod assist;+2 safety/equipment;+2 physical assistance Gait Distance (Feet): 15 Feet Assistive device: Rolling walker (2 wheeled) Gait Pattern/deviations: Narrow base of support;Step-through pattern;Decreased stride length     General Gait Details: very uncoordinated gait, required min and then mod assist for balance, worsening left lateral lean after 8 feet so limited distance for safety   Stairs             Wheelchair Mobility    Modified Rankin  (Stroke Patients Only)       Balance Overall balance assessment: Needs assistance         Standing balance support: Bilateral upper extremity supported;During functional activity Standing balance-Leahy Scale: Zero                              Cognition Arousal/Alertness: Awake/alert Behavior During Therapy: WFL for tasks assessed/performed Overall Cognitive Status: No family/caregiver present to determine baseline cognitive functioning Area of Impairment: Problem solving;Safety/judgement;Following commands;Attention;Orientation                 Orientation Level: Disoriented to;Place;Time;Situation Current Attention Level: Focused Memory: Decreased short-term memory Following Commands: Follows one step commands inconsistently Safety/Judgement: Decreased awareness of safety;Decreased awareness of deficits   Problem Solving: Slow processing;Decreased initiation;Difficulty sequencing;Requires verbal cues;Requires tactile cues General Comments: required multiple repetitions of cues for pt to initiate      Exercises      General Comments        Pertinent Vitals/Pain Pain Assessment: No/denies pain Pain Intervention(s): Repositioned;Monitored during session    Home Living                      Prior Function            PT Goals (current goals can now be found in the care plan section) Progress towards PT goals: Progressing toward goals    Frequency    Min 2X/week      PT Plan Current plan  remains appropriate    Co-evaluation              AM-PAC PT "6 Clicks" Mobility   Outcome Measure  Help needed turning from your back to your side while in a flat bed without using bedrails?: A Lot Help needed moving from lying on your back to sitting on the side of a flat bed without using bedrails?: A Lot Help needed moving to and from a bed to a chair (including a wheelchair)?: A Lot Help needed standing up from a chair using your arms  (e.g., wheelchair or bedside chair)?: A Lot Help needed to walk in hospital room?: A Lot Help needed climbing 3-5 steps with a railing? : Total 6 Click Score: 11    End of Session Equipment Utilized During Treatment: Gait belt Activity Tolerance: Patient tolerated treatment well Patient left: in chair;with chair alarm set;with call bell/phone within reach   PT Visit Diagnosis: Muscle weakness (generalized) (M62.81);Difficulty in walking, not elsewhere classified (R26.2)     Time: 1103-1594 PT Time Calculation (min) (ACUTE ONLY): 16 min  Charges:  $Gait Training: 8-22 mins                     Arlyce Dice, DPT Acute Rehabilitation Services Pager: [phone] Office: [phone]  Trena Platt 11/15/2019, 1:26 PM

## 2019-11-16 LAB — CBC
HCT: 27.6 % — ABNORMAL LOW (ref 39.0–52.0)
Hemoglobin: 9.2 g/dL — ABNORMAL LOW (ref 13.0–17.0)
MCH: 33.3 pg (ref 26.0–34.0)
MCHC: 33.3 g/dL (ref 30.0–36.0)
MCV: 100 fL (ref 80.0–100.0)
Platelets: 220 10*3/uL (ref 150–400)
RBC: 2.76 MIL/uL — ABNORMAL LOW (ref 4.22–5.81)
RDW: 13 % (ref 11.5–15.5)
WBC: 5 10*3/uL (ref 4.0–10.5)
nRBC: 0 % (ref 0.0–0.2)

## 2019-11-16 LAB — CULTURE, BLOOD (ROUTINE X 2)
Culture: NO GROWTH
Special Requests: ADEQUATE

## 2019-11-16 LAB — BASIC METABOLIC PANEL
Anion gap: 7 (ref 5–15)
BUN: 7 mg/dL (ref 6–20)
CO2: 27 mmol/L (ref 22–32)
Calcium: 9 mg/dL (ref 8.9–10.3)
Chloride: 105 mmol/L (ref 98–111)
Creatinine, Ser: 0.65 mg/dL (ref 0.61–1.24)
GFR calc Af Amer: >60 mL/min (ref >60)
GFR calc non Af Amer: >60 mL/min (ref >60)
Glucose, Bld: 97 mg/dL (ref 70–99)
Potassium: 3.8 mmol/L (ref 3.5–5.1)
Sodium: 139 mmol/L (ref 135–145)

## 2019-11-16 LAB — MAGNESIUM: Magnesium: 1.7 mg/dL (ref 1.7–2.4)

## 2019-11-16 NOTE — Progress Notes (Signed)
[facility] PROGRESS NOTE   GILL DELROSSI PNP:[id number] DOB: Apr 08, 1966 DOA: 11/11/2019  PCP: Patient, No Pcp Per  Brief History/Interval Summary: 54 y.o. male with no known past medical history (never sees doctors) who was brought to the ED via EMS after church members found patient minimally responsive, on his couch in his own urine and feces where he had apparently not moved from for at least 2 weeks.    Patient was nonverbal at the time of initial presentation.  Apparently he lives with his brother who also is in a similar situation.  Apparently drinks a lot of alcohol.  Most of the history was provided by patient's pastor.    Reason for Visit: Acute metabolic encephalopathy.  Alcohol withdrawal syndrome  Consultants: Psychiatry  Procedures: None  Antibiotics: Anti-infectives (From admission, onward)   None      Subjective/Interval History: Patient remains distracted and confused.  No complaints offered.      Assessment/Plan:  Acute metabolic encephalopathy/catatonia/Wernicke's/alcohol withdrawal delirium Patient did not have any focal deficits.  CT head was negative for acute findings.  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  Patient was nonverbal in the emergency room.   He did start speaking but noted to be very confused.  All of this appears to be secondary to Wernicke's.  Apparently drinks alcohol on a daily basis.  He likely also has nutritional deficiencies on top of that.  He was noted to have nystagmus. He was started on high-dose thiamine.  His B12 level was also low normal.  Started on vitamin B12 supplementation as well. Folate level is 6.1.  TSH is normal.  RPR is nonreactive.  HIV is nonreactive.  Ammonia level was less than 9. Mentation has not improved much compared to yesterday.  Continue to monitor.  Does follow commands but remains distracted.    Alcohol withdrawal syndrome in the setting of alcohol abuse Remains on CIWA protocol.  Tachycardia appears to have  improved.  Remains delirious.  Continue to monitor.  No further loose stools. Abdomen remains benign.    Mild rhabdomyolysis CK was noted to be 557 and started on IV fluids.  Subsequently CK levels were noted to be normal.  Renal function is normal.    Hypokalemia and hypomagnesemia Aggressively repleted yesterday.  Noted to be normal today.  Magnesium is 1.9.  Additional dose of potassium and magnesium today.     Moderate Protein calorie malnutrition This is due to chronic alcoholism as well as poor oral intake.   Bacteremia, likely contaminant Bacillus species noted in 1 set.  He is afebrile.  His WBC is normal.  Likely a contaminant.  Continue to monitor for now.  No antibiotics at this time.  Normocytic anemia Drop in hemoglobin is noted.  Most likely dilutional.  Anemia panel reviewed.  Ferritin is 1368.  TIBC 121.  No evidence of overt bleeding.  Continue to monitor.    Vitamin B12 deficiency B12 level noted to be low normal at 264.  Started on supplementation.  Dehydration Receiving IV fluids.  Rate was decreased yesterday.  Tobacco abuse Nicotine patch  Pressure injury Pressure Injury 11/11/19 Hip Right Unstageable - Full thickness tissue loss in which the base of the injury is covered by slough (yellow, tan, gray, green or brown) and/or eschar (tan, brown or black) in the wound bed. (Active)  11/11/19 1815  Location: Hip  Location Orientation: Right  Staging: Unstageable - Full thickness tissue loss in which the base of the injury is covered by  slough (yellow, tan, gray, green or brown) and/or eschar (tan, brown or black) in the wound bed.  Wound Description (Comments):   Present on Admission: Yes     Pressure Injury 11/11/19 Other (Comment) Left;Lateral Stage 2 -  Partial thickness loss of dermis presenting as a shallow open injury with a red, pink wound bed without slough. (Active)  11/11/19 1815  Location: Other (Comment)  Location Orientation: Left;Lateral    Staging: Stage 2 -  Partial thickness loss of dermis presenting as a shallow open injury with a red, pink wound bed without slough.  Wound Description (Comments):   Present on Admission: Yes     DVT Prophylaxis: Lovenox Code Status: Full code Family Communication: No family at bedside Disposition Plan:  Status is: Inpatient  Remains inpatient appropriate because:Altered mental status, Unsafe d/c plan and IV treatments appropriate due to intensity of illness or inability to take PO   Dispo:  Patient From: Home  Planned Disposition: To be determined  Expected discharge date: 11/18/19  Medically stable for discharge: No        Medications:  Scheduled: . enoxaparin (LOVENOX) injection  40 mg Subcutaneous Q24H  . feeding supplement (ENSURE ENLIVE)  237 mL Oral BID BM  . folic acid  1 mg Intravenous Daily  . LORazepam  0-4 mg Oral Q12H  . multivitamin with minerals  1 tablet Oral Daily  . nicotine  14 mg Transdermal Daily  . QUEtiapine  25 mg Oral BID  . [START ON 11/20/2019] thiamine  100 mg Oral Daily  . vitamin B-12  1,000 mcg Oral Daily   Continuous: . 0.9 % NaCl with KCl 20 mEq / L 50 mL/hr at 11/16/19 1011  . thiamine injection 250 mg (11/16/19 0617)   DTO:[id number] **OR** acetaminophen, bisacodyl, hydrALAZINE, LORazepam **OR** LORazepam, ondansetron **OR** ondansetron (ZOFRAN) IV, senna-docusate   Objective:  Vital Signs  Vitals:   11/15/19 2002 11/16/19 0608 11/16/19 1011 11/16/19 1012  BP: 122/88 133/86 140/90   Pulse: (!) 102 100 99   Resp: 17 16 18    Temp: 98 F (36.7 C) 98.3 F (36.8 C)  98.2 F (36.8 C)  TempSrc: Oral Oral  Oral  SpO2: 98% 98% 100%   Weight:      Height:        Intake/Output Summary (Last 24 hours) at 11/16/2019 1157 Last data filed at 11/16/2019 0639 Gross per 24 hour  Intake 1815.61 ml  Output 1700 ml  Net 115.61 ml   Filed Weights   11/11/19 1957  Weight: 54.1 kg    General appearance: Somnolent but easily  arousable.  No distress.  Remains confused. Resp: Clear to auscultation bilaterally.  Normal effort Cardio: S1-S2 is normal regular.  No S3-S4.  No rubs murmurs or bruit GI: Abdomen is soft.  Nontender nondistended.  Bowel sounds are present normal.  No masses organomegaly Extremities: No edema.  Moving all his extremities. Neurologic: Mildly delirious.  No focal neurological deficits.    Lab Results:  Data Reviewed: I have personally reviewed following labs and imaging studies  CBC: Recent Labs  Lab 11/12/19 1229 11/13/19 0722 11/14/19 0555 11/15/19 0554 11/16/19 0544  WBC 10.0 10.0 10.8* 5.6 5.0  HGB 12.5* 10.7* 8.9* 8.9* 9.2*  HCT 36.9* 32.1* 26.0* 27.3* 27.6*  MCV 97.6 98.5 97.7 101.5* 100.0  PLT 166 212 196 210 825    Basic Metabolic Panel: Recent Labs  Lab 11/12/19 1229 11/12/19 1229 11/13/19 0539 11/14/19 0555 11/14/19 1937 11/15/19 0554 11/16/19 0544  NA 134*  --  134* 133*  --  137 139  K 4.9   < > 4.1 2.5* 3.5 3.7 3.8  CL 102  --  97* 99  --  104 105  CO2 21*  --  27 25  --  27 27  GLUCOSE 100*  --  102* 100*  --  86 97  BUN 7  --  7 5*  --  6 7  CREATININE 0.50*  --  0.53* 0.43*  --  0.51* 0.65  CALCIUM 8.7*  --  8.7* 8.3*  --  8.4* 9.0  MG 1.6*  --  1.7 1.5*  --  1.9 1.7   < > = values in this interval not displayed.    GFR: Estimated Creatinine Clearance: 80.8 mL/min (by C-G formula based on SCr of 0.65 mg/dL).  Liver Function Tests: Recent Labs  Lab 11/11/19 1144 11/12/19 1229 11/13/19 0722 11/14/19 0555  AST 44* 43* 35 28  ALT 34 26 29 23   ALKPHOS 62 52 53 46  BILITOT 1.2 0.9 0.9 0.8  PROT 8.4* 6.9 7.3 6.8  ALBUMIN 2.8* 2.2* 2.4* 2.1*     Recent Labs  Lab 11/11/19 1941  AMMONIA <9*    Coagulation Profile: Recent Labs  Lab 11/11/19 1144  INR 1.0    Cardiac Enzymes: Recent Labs  Lab 11/11/19 1144 11/12/19 1229 11/13/19 0722  CKTOTAL 557* 299 141    Anemia Panel: Recent Labs    11/14/19 0555  FOLATE 6.1    FERRITIN 1,368*  TIBC 121*  IRON 18*  RETICCTPCT 1.2    Recent Results (from the past 240 hour(s))  Culture, blood (Routine X 2) w Reflex to ID Panel     Status: None   Collection Time: 11/11/19 11:44 AM   Specimen: BLOOD  Result Value Ref Range Status   Specimen Description   Final    BLOOD LEFT ANTECUBITAL Performed at [facility], [address], [place], [place]    Special Requests   Final    BOTTLES DRAWN AEROBIC AND ANAEROBIC Blood Culture adequate volume Performed at [facility], [address], [place], [place]    Culture   Final    NO GROWTH 5 DAYS Performed at [facility], [address] [place], [place]    Report Status 11/16/2019 FINAL  Final  SARS Coronavirus 2 by RT PCR (hospital order, performed in [facility] hospital lab) Nasopharyngeal Nasopharyngeal Swab     Status: None   Collection Time: 11/11/19  2:41 PM   Specimen: Nasopharyngeal Swab  Result Value Ref Range Status   SARS Coronavirus 2 NEGATIVE NEGATIVE Final    Comment: (NOTE) SARS-CoV-2 target nucleic acids are NOT DETECTED.  The SARS-CoV-2 RNA is generally detectable in upper and lower respiratory specimens during the acute phase of infection. The lowest concentration of SARS-CoV-2 viral copies this assay can detect is 250 copies / mL. A negative result does not preclude SARS-CoV-2 infection and should not be used as the sole basis for treatment or other patient management decisions.  A negative result may occur with improper specimen collection / handling, submission of specimen other than nasopharyngeal swab, presence of viral mutation(s) within the areas targeted by this assay, and inadequate number of viral copies (<250 copies / mL). A negative result must be combined with clinical observations, patient history, and epidemiological information.  Fact Sheet for Patients:    11/13/19  Fact Sheet for  Healthcare Providers: https://pope.com/  This test is not yet approved or  cleared by the [place] and has been authorized for detection and/or diagnosis of SARS-CoV-2 by FDA under an Emergency Use Authorization (EUA).  This EUA will remain in effect (meaning this test can be used) for the duration of the COVID-19 declaration under Section 564(b)(1) of the Act, 21 U.S.C. section 360bbb-3(b)(1), unless the authorization is terminated or revoked sooner.  Performed at [facility], [address], [place], [place] [address]   Culture, blood (Routine X 2) w Reflex to ID Panel     Status: Abnormal   Collection Time: 11/11/19  2:55 PM   Specimen: BLOOD  Result Value Ref Range Status   Specimen Description   Final    BLOOD RIGHT ANTECUBITAL Performed at [facility], [address], [place], [place] [address]    Special Requests   Final    BOTTLES DRAWN AEROBIC AND ANAEROBIC Blood Culture adequate volume Performed at [facility], [address], [place], [place] [address]    Culture  Setup Time   Final    GRAM POSITIVE RODS AEROBIC BOTTLE ONLY CRITICAL RESULT CALLED TO, READ BACK BY AND VERIFIED WITH: PHARMD NICK  GLOGOVAC [id number] FCP     Culture (A)  Final    BACILLUS SPECIES Standardized susceptibility testing for this organism is not available. Performed at [facility], [address], [place], [place] [address]    Report Status 11/13/2019 FINAL  Final      Radiology Studies: No results found.     LOS: 4 days   Milledge Gerding [employer] on [email or web address]  11/16/2019, 11:57 AM

## 2019-11-17 LAB — CBC
HCT: 33.3 % — ABNORMAL LOW (ref 39.0–52.0)
Hemoglobin: 10.8 g/dL — ABNORMAL LOW (ref 13.0–17.0)
MCH: 32.6 pg (ref 26.0–34.0)
MCHC: 32.4 g/dL (ref 30.0–36.0)
MCV: 100.6 fL — ABNORMAL HIGH (ref 80.0–100.0)
Platelets: 260 10*3/uL (ref 150–400)
RBC: 3.31 MIL/uL — ABNORMAL LOW (ref 4.22–5.81)
RDW: 13.1 % (ref 11.5–15.5)
WBC: 5 10*3/uL (ref 4.0–10.5)
nRBC: 0 % (ref 0.0–0.2)

## 2019-11-17 LAB — BASIC METABOLIC PANEL
Anion gap: 9 (ref 5–15)
BUN: 8 mg/dL (ref 6–20)
CO2: 28 mmol/L (ref 22–32)
Calcium: 9.7 mg/dL (ref 8.9–10.3)
Chloride: 101 mmol/L (ref 98–111)
Creatinine, Ser: 0.56 mg/dL — ABNORMAL LOW (ref 0.61–1.24)
GFR calc Af Amer: >60 mL/min (ref >60)
GFR calc non Af Amer: >60 mL/min (ref >60)
Glucose, Bld: 102 mg/dL — ABNORMAL HIGH (ref 70–99)
Potassium: 4.5 mmol/L (ref 3.5–5.1)
Sodium: 138 mmol/L (ref 135–145)

## 2019-11-17 LAB — MAGNESIUM: Magnesium: 1.6 mg/dL — ABNORMAL LOW (ref 1.7–2.4)

## 2019-11-17 MED ORDER — MAGNESIUM SULFATE 2 GM/50ML IV SOLN
2.0000 g | Freq: Once | INTRAVENOUS | Status: AC
  Administered 2019-11-17: 2 g via INTRAVENOUS
  Filled 2019-11-17: qty 50

## 2019-11-17 MED ORDER — MAGNESIUM OXIDE 400 (241.3 MG) MG PO TABS
400.0000 mg | ORAL_TABLET | Freq: Two times a day (BID) | ORAL | Status: DC
  Administered 2019-11-17 – 2019-11-26 (×19): 400 mg via ORAL
  Filled 2019-11-17 (×19): qty 1

## 2019-11-17 NOTE — Progress Notes (Signed)
MD, please consult WOC. Patient has 2 unstageable wounds (one to sacrum and one to right hip). Thanks, Priscille Kluver

## 2019-11-17 NOTE — Progress Notes (Signed)
Patient sat up in chair for several hours, tolerated well.

## 2019-11-17 NOTE — Progress Notes (Signed)
[facility] PROGRESS NOTE   GIANLUCCA SZYMBORSKI GBT:[id number] DOB: 1966/03/16 DOA: 11/11/2019  PCP: Patient, No Pcp Per  Brief History/Interval Summary: 54 y.o. male with no known past medical history (never sees doctors) who was brought to the ED via EMS after church members found patient minimally responsive, on his couch in his own urine and feces where he had apparently not moved from for at least 2 weeks.    Patient was nonverbal at the time of initial presentation.  Apparently he lives with his brother who also is in a similar situation.  Apparently drinks a lot of alcohol.  Most of the history was provided by patient's pastor.    Reason for Visit: Acute metabolic encephalopathy.  Alcohol withdrawal syndrome  Consultants: Psychiatry  Procedures: None  Antibiotics: Anti-infectives (From admission, onward)   None      Subjective/Interval History: Patient sitting up in the chair eating his breakfast.  He was much more responsive than he has been in the last several days.  States that he lives with his brother.  He has a generator which powers his home.    Assessment/Plan:  Acute metabolic encephalopathy/catatonia/Wernicke's/alcohol withdrawal delirium Patient did not have any focal deficits.  CT head was negative for acute findings.  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  Patient was nonverbal in the emergency room. He did start speaking but noted to be very confused.  All of this appears to be secondary to Wernicke's.  Apparently drinks alcohol on a daily basis.  He likely also has nutritional deficiencies on top of that.  He was noted to have nystagmus. Patient was started on high-dose thiamine.  He was also started on B12 supplementation.   Folic acid level was 6.1.  TSH was normal.  RPR nonreactive.  HIV nonreactive.  Ammonia level less than 9. Patient's mentation has improved in the last 48 hours.  He was able to tell me that he was at [facility].  Still a little  distracted.  Hopefully he will continue to improve.  No focal deficits.   Alcohol withdrawal syndrome in the setting of alcohol abuse Withdrawal symptoms appear to be improving.  Tachycardia has resolved.  Delirium seems to be improving as well.  No further episodes of diarrhea.  Mild rhabdomyolysis CK was noted to be 557.  Resolved with IV fluids.    Hypokalemia and hypomagnesemia Potassium is noted to be normal today.  Magnesium again low which will be repleted.  He will be also started on magnesium oxide.     Moderate Protein calorie malnutrition This is due to chronic alcoholism as well as poor oral intake.   Bacteremia, likely contaminant Bacillus species noted in 1 set.  He is afebrile.  His WBC is normal.  Likely a contaminant.  Continue to monitor for now.  No antibiotics at this time.  Normocytic anemia Drop in hemoglobin is noted.  Most likely dilutional.  Anemia panel reviewed.  Ferritin is 1368.  TIBC 121.  No evidence of overt bleeding.  Continue to monitor.    Vitamin B12 deficiency B12 level noted to be low normal at 264.  Started on supplementation.  Dehydration Received IV fluids.  Oral intake seems to have improved.  IV fluids discontinued.  Tobacco abuse Nicotine patch  Pressure injury Pressure Injury 11/11/19 Hip Right Unstageable - Full thickness tissue loss in which the base of the injury is covered by slough (yellow, tan, gray, green or brown) and/or eschar (tan, brown or black) in the  wound bed. (Active)  11/11/19 1815  Location: Hip  Location Orientation: Right  Staging: Unstageable - Full thickness tissue loss in which the base of the injury is covered by slough (yellow, tan, gray, green or brown) and/or eschar (tan, brown or black) in the wound bed.  Wound Description (Comments):   Present on Admission: Yes     Pressure Injury 11/11/19 Other (Comment) Left;Lateral Stage 2 -  Partial thickness loss of dermis presenting as a shallow open injury with a  red, pink wound bed without slough. (Active)  11/11/19 1815  Location: Other (Comment)  Location Orientation: Left;Lateral  Staging: Stage 2 -  Partial thickness loss of dermis presenting as a shallow open injury with a red, pink wound bed without slough.  Wound Description (Comments):   Present on Admission: Yes   Skilled nursing facility was recommended by physical therapy.  Patient is uninsured.  However he appears to have improved in the last 48 hours.  We will have PT reevaluate.  DVT Prophylaxis: Lovenox Code Status: Full code Family Communication: No family at bedside Disposition Plan:  Status is: Inpatient  Remains inpatient appropriate because:Altered mental status, Unsafe d/c plan and IV treatments appropriate due to intensity of illness or inability to take PO   Dispo:  Patient From: Home  Planned Disposition: To be determined  Expected discharge date: 11/18/19  Medically stable for discharge: No        Medications:  Scheduled: . enoxaparin (LOVENOX) injection  40 mg Subcutaneous Q24H  . feeding supplement (ENSURE ENLIVE)  237 mL Oral BID BM  . folic acid  1 mg Intravenous Daily  . LORazepam  0-4 mg Oral Q12H  . magnesium oxide  400 mg Oral BID  . multivitamin with minerals  1 tablet Oral Daily  . nicotine  14 mg Transdermal Daily  . QUEtiapine  25 mg Oral BID  . [START ON 11/20/2019] thiamine  100 mg Oral Daily  . vitamin B-12  1,000 mcg Oral Daily   Continuous: . thiamine injection 250 mg (11/17/19 0636)   QAS:[id number] **OR** acetaminophen, bisacodyl, hydrALAZINE, ondansetron **OR** ondansetron (ZOFRAN) IV, senna-docusate   Objective:  Vital Signs  Vitals:   11/16/19 1012 11/16/19 1342 11/16/19 2105 11/17/19 0603  BP:  (!) 141/79 139/75 (!) 128/91  Pulse:  88 95 96  Resp:  20 19 15   Temp: 98.2 F (36.8 C) 97.9 F (36.6 C) 98.2 F (36.8 C) 98.5 F (36.9 C)  TempSrc: Oral Oral Oral   SpO2:  99% 98% 100%  Weight:      Height:         Intake/Output Summary (Last 24 hours) at 11/17/2019 1202 Last data filed at 11/17/2019 1002 Gross per 24 hour  Intake 1885 ml  Output 350 ml  Net 1535 ml   Filed Weights   11/11/19 1957  Weight: 54.1 kg    General appearance: Awake alert.  In no distress Resp: Clear to auscultation bilaterally.  Normal effort Cardio: S1-S2 is normal regular.  No S3-S4.  No rubs murmurs or bruit GI: Abdomen is soft.  Nontender nondistended.  Bowel sounds are present normal.  No masses organomegaly Extremities: No edema.  Full range of motion of lower extremities. Neurologic: He is alert.  Oriented to place.  Did not know the year or the month.  He knew the previous president.  No focal deficits.   Lab Results:  Data Reviewed: I have personally reviewed following labs and imaging studies  CBC: Recent Labs  Lab 11/13/19 0722 11/14/19 0555 11/15/19 0554 11/16/19 0544 11/17/19 0642  WBC 10.0 10.8* 5.6 5.0 5.0  HGB 10.7* 8.9* 8.9* 9.2* 10.8*  HCT 32.1* 26.0* 27.3* 27.6* 33.3*  MCV 98.5 97.7 101.5* 100.0 100.6*  PLT 212 196 210 220 585    Basic Metabolic Panel: Recent Labs  Lab 11/13/19 0722 11/13/19 0722 11/14/19 0555 11/14/19 1937 11/15/19 0554 11/16/19 0544 11/17/19 0642  NA 134*  --  133*  --  137 139 138  K 4.1   < > 2.5* 3.5 3.7 3.8 4.5  CL 97*  --  99  --  104 105 101  CO2 27  --  25  --  27 27 28   GLUCOSE 102*  --  100*  --  86 97 102*  BUN 7  --  5*  --  6 7 8   CREATININE 0.53*  --  0.43*  --  0.51* 0.65 0.56*  CALCIUM 8.7*  --  8.3*  --  8.4* 9.0 9.7  MG 1.7  --  1.5*  --  1.9 1.7 1.6*   < > = values in this interval not displayed.    GFR: Estimated Creatinine Clearance: 80.8 mL/min (A) (by C-G formula based on SCr of 0.56 mg/dL (L)).  Liver Function Tests: Recent Labs  Lab 11/11/19 1144 11/12/19 1229 11/13/19 0722 11/14/19 0555  AST 44* 43* 35 28  ALT 34 26 29 23   ALKPHOS 62 52 53 46  BILITOT 1.2 0.9 0.9 0.8  PROT 8.4* 6.9 7.3 6.8  ALBUMIN 2.8* 2.2*  2.4* 2.1*     Recent Labs  Lab 11/11/19 1941  AMMONIA <9*    Coagulation Profile: Recent Labs  Lab 11/11/19 1144  INR 1.0    Cardiac Enzymes: Recent Labs  Lab 11/11/19 1144 11/12/19 1229 11/13/19 0722  CKTOTAL 557* 299 141     Recent Results (from the past 240 hour(s))  Culture, blood (Routine X 2) w Reflex to ID Panel     Status: None   Collection Time: 11/11/19 11:44 AM   Specimen: BLOOD  Result Value Ref Range Status   Specimen Description   Final    BLOOD LEFT ANTECUBITAL Performed at [facility] [place] [address] [place], [place] [address]    Special Requests   Final    BOTTLES DRAWN AEROBIC AND ANAEROBIC Blood Culture adequate volume Performed at [place] [address], [place], [place] [address]    Culture   Final    NO GROWTH 5 DAYS Performed at [facility], [place] [address] [place], [place] [address]    Report Status 11/16/2019 FINAL  Final  SARS Coronavirus 2 by RT PCR (hospital order, performed in [facility] hospital lab) Nasopharyngeal Nasopharyngeal Swab     Status: None   Collection Time: 11/11/19  2:41 PM   Specimen: Nasopharyngeal Swab  Result Value Ref Range Status   SARS Coronavirus 2 NEGATIVE NEGATIVE Final    Comment: (NOTE) SARS-CoV-2 target nucleic acids are NOT DETECTED.  The SARS-CoV-2 RNA is generally detectable in upper and lower respiratory specimens during the acute phase of infection. The lowest concentration of SARS-CoV-2 viral copies this assay can detect is 250 copies / mL. A negative result does not preclude SARS-CoV-2 infection and should not be used as the sole basis for treatment or other patient management decisions.  A negative result may occur with improper specimen collection / handling, submission of specimen other than nasopharyngeal swab, presence of  viral mutation(s) within the areas targeted by this assay, and inadequate number of viral copies (<250 copies /  mL). A negative result must be combined with clinical observations, patient history, and epidemiological information.  Fact Sheet for Patients:   BoilerBrush.com.cy  Fact Sheet for Healthcare Providers: https://pope.com/  This test is not yet approved or  cleared by the [place] FDA and has been authorized for detection and/or diagnosis of SARS-CoV-2 by FDA under an Emergency Use Authorization (EUA).  This EUA will remain in effect (meaning this test can be used) for the duration of the COVID-19 declaration under Section 564(b)(1) of the Act, 21 U.S.C. section 360bbb-3(b)(1), unless the authorization is terminated or revoked sooner.  Performed at [facility], [address] [place], [place] [address]   Culture, blood (Routine X 2) w Reflex to ID Panel     Status: Abnormal   Collection Time: 11/11/19  2:55 PM   Specimen: BLOOD  Result Value Ref Range Status   Specimen Description   Final    BLOOD RIGHT ANTECUBITAL Performed at [facility], [address] [place], [place] [address]    Special Requests   Final    BOTTLES DRAWN AEROBIC AND ANAEROBIC Blood Culture adequate volume Performed at [facility], [address], [place], [place] [address]    Culture  Setup Time   Final    GRAM POSITIVE RODS AEROBIC BOTTLE ONLY CRITICAL RESULT CALLED TO, READ BACK BY AND VERIFIED WITH: PHARMD NICK  GLOGOVAC [id number] FCP     Culture (A)  Final    BACILLUS SPECIES Standardized susceptibility testing for this organism is not available. Performed at [facility], [address], [place], [place] [address]    Report Status 11/13/2019 FINAL  Final      Radiology Studies: No results found.     LOS: 5 days   Nonnie Pickney [employer] on [email or web address]  11/17/2019, 12:02 PM

## 2019-11-18 LAB — BASIC METABOLIC PANEL
Anion gap: 9 (ref 5–15)
BUN: 15 mg/dL (ref 6–20)
CO2: 27 mmol/L (ref 22–32)
Calcium: 9.2 mg/dL (ref 8.9–10.3)
Chloride: 99 mmol/L (ref 98–111)
Creatinine, Ser: 0.66 mg/dL (ref 0.61–1.24)
GFR calc Af Amer: >60 mL/min (ref >60)
GFR calc non Af Amer: >60 mL/min (ref >60)
Glucose, Bld: 96 mg/dL (ref 70–99)
Potassium: 4.7 mmol/L (ref 3.5–5.1)
Sodium: 135 mmol/L (ref 135–145)

## 2019-11-18 LAB — CBC
HCT: 39.8 % (ref 39.0–52.0)
Hemoglobin: 12.9 g/dL — ABNORMAL LOW (ref 13.0–17.0)
MCH: 32.4 pg (ref 26.0–34.0)
MCHC: 32.4 g/dL (ref 30.0–36.0)
MCV: 100 fL (ref 80.0–100.0)
Platelets: 208 10*3/uL (ref 150–400)
RBC: 3.98 MIL/uL — ABNORMAL LOW (ref 4.22–5.81)
RDW: 13.1 % (ref 11.5–15.5)
WBC: 5.8 10*3/uL (ref 4.0–10.5)
nRBC: 0 % (ref 0.0–0.2)

## 2019-11-18 LAB — MAGNESIUM: Magnesium: 1.8 mg/dL (ref 1.7–2.4)

## 2019-11-18 MED ORDER — ALPRAZOLAM 0.5 MG PO TABS
0.5000 mg | ORAL_TABLET | Freq: Once | ORAL | Status: AC
  Administered 2019-11-18: 0.5 mg via ORAL
  Filled 2019-11-18: qty 1

## 2019-11-18 NOTE — Progress Notes (Signed)
[facility] PROGRESS NOTE   [name]:[id number] DOB: [birth year] DOA: 11/11/2019  PCP: Randall Garcia, No Pcp Per  Brief History/Interval Summary: 54 y.o. male with no known past medical history (never sees doctors) who was brought to the ED via EMS after church members found Randall Garcia minimally responsive, on his couch in his own urine and feces where he had apparently not moved from for at least 2 weeks.    Randall Garcia was nonverbal at the time of initial presentation.  Apparently he lives with his brother who also is in a similar situation.  Apparently drinks a lot of alcohol.  Most of the history was provided by Randall Garcia's pastor.    Reason for Visit: Acute metabolic encephalopathy.  Alcohol withdrawal syndrome  Consultants: Psychiatry  Procedures: None  Antibiotics: Anti-infectives (From admission, onward)   None      Subjective/Interval History: Randall Garcia noted to be somewhat distracted this morning.  He refused to get up and sit in the chair.  Otherwise denies any complaints.      Assessment/Plan:  Acute metabolic encephalopathy/catatonia/Wernicke's/alcohol withdrawal delirium Randall Garcia did not have any focal deficits.  CT head was negative for acute findings.  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  Randall Garcia was nonverbal in the emergency room. He did start speaking but noted to be very confused.  All of this appears to be secondary to Wernicke's.  Apparently drinks alcohol on a daily basis.  He likely also has nutritional deficiencies on top of that.  He was noted to have nystagmus. Randall Garcia was started on high-dose thiamine.  He was also started on B12 supplementation.   Folic acid level was 6.1.  TSH was normal.  RPR nonreactive.  HIV nonreactive.  Ammonia level less than 9. His mentation has improved compared to the time of admission however he remains distracted.  Still does not know where he is and remains disoriented regarding year or month.  No obvious focal deficits are noted.     His alcohol withdrawal delirium seems to be improving but he appears to have cognitive deficits which could be multifactorial including Wernicke's, B12 deficiency as well as vascular considering old infarcts noted on CT brain.   Alcohol withdrawal syndrome in the setting of alcohol abuse Alcohol withdrawal symptoms appears to have improved.  Delirium is improving but he appears to have cognitive deficits which could be related to Wernicke's and B12 deficiency.    Mild rhabdomyolysis CK was noted to be 557.  Resolved with IV fluids.    Hypokalemia and hypomagnesemia Been corrected.  Potassium has been normal the last 2 days.  He is now on magnesium oxide.  Magnesium level was 1.8 this morning.  Moderate Protein calorie malnutrition This is due to chronic alcoholism as well as poor oral intake.   Bacteremia, likely contaminant Bacillus species noted in 1 set.  He is afebrile.  His WBC is normal.  Likely a contaminant.  Continue to monitor for now.  No antibiotics at this time.  Normocytic anemia Drop in hemoglobin is noted.  Most likely dilutional.  Anemia panel reviewed.  Ferritin is 1368.  TIBC 121.  No evidence of overt bleeding.  Continue to monitor.    Vitamin B12 deficiency B12 level noted to be low normal at 264.  Started on supplementation.  Dehydration Received IV fluids.  Oral intake seems to have improved.  IV fluids discontinued.  Tobacco abuse Nicotine patch  Pressure injury Pressure Injury 11/11/19 Hip Right Unstageable - Full thickness tissue loss in which the  base of the injury is covered by slough (yellow, tan, gray, green or brown) and/or eschar (tan, brown or black) in the wound bed. (Active)  11/11/19 1815  Location: Hip  Location Orientation: Right  Staging: Unstageable - Full thickness tissue loss in which the base of the injury is covered by slough (yellow, tan, gray, green or brown) and/or eschar (tan, brown or black) in the wound bed.  Wound Description  (Comments):   Present on Admission: Yes     Pressure Injury 11/11/19 Other (Comment) Left;Lateral Stage 2 -  Partial thickness loss of dermis presenting as a shallow open injury with a red, pink wound bed without slough. (Active)  11/11/19 1815  Location: Other (Comment)  Location Orientation: Left;Lateral  Staging: Stage 2 -  Partial thickness loss of dermis presenting as a shallow open injury with a red, pink wound bed without slough.  Wound Description (Comments):   Present on Admission: Yes   Skilled nursing facility was recommended by physical therapy.  Randall Garcia is uninsured.  He was apparently living with his brother.  They were living "off the grid."  Randall Garcia remains disoriented.  Even though medically he may be getting close to stability there is no safe discharge plan in place at this time.    DVT Prophylaxis: Lovenox Code Status: Full code Family Communication: No family at bedside Disposition Plan:  Status is: Inpatient  Remains inpatient appropriate because:Altered mental status   Dispo:  Randall Garcia From: Home  Planned Disposition: To be determined  Expected discharge date: 11/20/19  Medically stable for discharge: No        Medications:  Scheduled: . enoxaparin (LOVENOX) injection  40 mg Subcutaneous Q24H  . feeding supplement (ENSURE ENLIVE)  237 mL Oral BID BM  . folic acid  1 mg Intravenous Daily  . magnesium oxide  400 mg Oral BID  . multivitamin with minerals  1 tablet Oral Daily  . nicotine  14 mg Transdermal Daily  . QUEtiapine  25 mg Oral BID  . [START ON 11/20/2019] thiamine  100 mg Oral Daily  . vitamin B-12  1,000 mcg Oral Daily   Continuous: . thiamine injection 250 mg (11/18/19 0625)   YHC:[id number] **OR** acetaminophen, bisacodyl, hydrALAZINE, ondansetron **OR** ondansetron (ZOFRAN) IV, senna-docusate   Objective:  Vital Signs  Vitals:   11/17/19 1300 11/17/19 1314 11/17/19 2100 11/18/19 0641  BP: 97/72 112/76 115/74 111/74  Pulse:  100  89 (!) 102  Resp: 18  18 17   Temp: 98.2 F (36.8 C)  98.4 F (36.9 C) 98.7 F (37.1 C)  TempSrc: Oral  Oral   SpO2: 96%  100% 98%  Weight:      Height:        Intake/Output Summary (Last 24 hours) at 11/18/2019 1131 Last data filed at 11/18/2019 0900 Gross per 24 hour  Intake 840 ml  Output 1100 ml  Net -260 ml   Filed Weights   11/11/19 1957  Weight: 54.1 kg    General appearance: Awake alert.  In no distress remains distracted and disoriented. Resp: Clear to auscultation bilaterally.  Normal effort Cardio: S1-S2 is normal regular.  No S3-S4.  No rubs murmurs or bruit GI: Abdomen is soft.  Nontender nondistended.  Bowel sounds are present normal.  No masses organomegaly Extremities: No edema.  Full range of motion of lower extremities. Neurologic: Alert.  Today he cannot even tell me where he was.  Remains disoriented.  No focal deficits.  Nystagmus has improved.   Lab  Results:  Data Reviewed: I have personally reviewed following labs and imaging studies  CBC: Recent Labs  Lab 11/14/19 0555 11/15/19 0554 11/16/19 0544 11/17/19 0642 11/18/19 0619  WBC 10.8* 5.6 5.0 5.0 5.8  HGB 8.9* 8.9* 9.2* 10.8* 12.9*  HCT 26.0* 27.3* 27.6* 33.3* 39.8  MCV 97.7 101.5* 100.0 100.6* 100.0  PLT 196 210 220 260 208    Basic Metabolic Panel: Recent Labs  Lab 11/14/19 0555 11/14/19 0555 11/14/19 1937 11/15/19 0554 11/16/19 0544 11/17/19 0642 11/18/19 0619  NA 133*  --   --  137 139 138 135  K 2.5*   < > 3.5 3.7 3.8 4.5 4.7  CL 99  --   --  104 105 101 99  CO2 25  --   --  27 27 28 27   GLUCOSE 100*  --   --  86 97 102* 96  BUN 5*  --   --  6 7 8 15   CREATININE 0.43*  --   --  0.51* 0.65 0.56* 0.66  CALCIUM 8.3*  --   --  8.4* 9.0 9.7 9.2  MG 1.5*  --   --  1.9 1.7 1.6* 1.8   < > = values in this interval not displayed.    GFR: Estimated Creatinine Clearance: 80.8 mL/min (by C-G formula based on SCr of 0.66 mg/dL).  Liver Function Tests: Recent Labs  Lab  11/11/19 1144 11/12/19 1229 11/13/19 0722 11/14/19 0555  AST 44* 43* 35 28  ALT 34 26 29 23   ALKPHOS 62 52 53 46  BILITOT 1.2 0.9 0.9 0.8  PROT 8.4* 6.9 7.3 6.8  ALBUMIN 2.8* 2.2* 2.4* 2.1*     Recent Labs  Lab 11/11/19 1941  AMMONIA <9*    Coagulation Profile: Recent Labs  Lab 11/11/19 1144  INR 1.0    Cardiac Enzymes: Recent Labs  Lab 11/11/19 1144 11/12/19 1229 11/13/19 0722  CKTOTAL 557* 299 141     Recent Results (from the past 240 hour(s))  Culture, blood (Routine X 2) w Reflex to ID Panel     Status: None   Collection Time: 11/11/19 11:44 AM   Specimen: BLOOD  Result Value Ref Range Status   Specimen Description   Final    BLOOD LEFT ANTECUBITAL Performed at [facility], [address] [place], [place]    Special Requests   Final    BOTTLES DRAWN AEROBIC AND ANAEROBIC Blood Culture adequate volume Performed at [facility], [address] [place], [place]    Culture   Final    NO GROWTH 5 DAYS Performed at [facility], [address] [place], [facility] [address]    Report Status 11/16/2019 FINAL  Final  SARS Coronavirus 2 by RT PCR (hospital order, performed in [facility] hospital lab) Nasopharyngeal Nasopharyngeal Swab     Status: None   Collection Time: 11/11/19  2:41 PM   Specimen: Nasopharyngeal Swab  Result Value Ref Range Status   SARS Coronavirus 2 NEGATIVE NEGATIVE Final    Comment: (NOTE) SARS-CoV-2 target nucleic acids are NOT DETECTED.  The SARS-CoV-2 RNA is generally detectable in upper and lower respiratory specimens during the acute phase of infection. The lowest concentration of SARS-CoV-2 viral copies this assay can detect is 250 copies / mL. A negative result does not preclude SARS-CoV-2 infection and should not be used as the sole basis for treatment or other Randall Garcia management decisions.  A negative result may  occur with improper specimen collection /  handling, submission of specimen other than nasopharyngeal swab, presence of viral mutation(s) within the areas targeted by this assay, and inadequate number of viral copies (<250 copies / mL). A negative result must be combined with clinical observations, Randall Garcia history, and epidemiological information.  Fact Sheet for Patients:   BoilerBrush.com.cy  Fact Sheet for Healthcare Providers: https://pope.com/  This test is not yet approved or  cleared by the [place] FDA and has been authorized for detection and/or diagnosis of SARS-CoV-2 by FDA under an Emergency Use Authorization (EUA).  This EUA will remain in effect (meaning this test can be used) for the duration of the COVID-19 declaration under Section 564(b)(1) of the Act, 21 U.S.C. section 360bbb-3(b)(1), unless the authorization is terminated or revoked sooner.  Performed at [facility], [address] [place], [place] [address]   Culture, blood (Routine X 2) w Reflex to ID Panel     Status: Abnormal   Collection Time: 11/11/19  2:55 PM   Specimen: BLOOD  Result Value Ref Range Status   Specimen Description   Final    BLOOD RIGHT ANTECUBITAL Performed at [facility], [address] [place], [place] [address]    Special Requests   Final    BOTTLES DRAWN AEROBIC AND ANAEROBIC Blood Culture adequate volume Performed at [facility], [address] [place], [place] [address]    Culture  Setup Time   Final    GRAM POSITIVE RODS AEROBIC BOTTLE ONLY CRITICAL RESULT CALLED TO, READ BACK BY AND VERIFIED WITH: PHARMD NICK  GLOGOVAC [id number] FCP     Culture (A)  Final    BACILLUS SPECIES Standardized susceptibility testing for this organism is not available. Performed at [facility], [address] [place], [place] [address]    Report Status 11/13/2019 FINAL  Final      Radiology Studies: No  results found.     LOS: 6 days   Jakhai Fant [employer] on [email or web address]  11/18/2019, 11:31 AM

## 2019-11-18 NOTE — Progress Notes (Signed)
Physical Therapy Treatment Patient Details Name: Randall Garcia MRN: [id number] DOB: Sep 20, 1965 Today's Date: 11/18/2019    History of Present Illness 54 y.o. male with no known past medical history (never sees doctors) who was brought to the ED via EMS after [employer] members found patient minimally responsive, on his couch in his own urine and feces where he had apparently not moved from for at least 2 weeks. Diagnosis: acute met encephalopahty, rhabdomyolysis.    PT Comments    Pt progressing slowly. Cooperative with PT however highly distractible/confused. Requires incr time and repetition to initiate and complete tasks.   Follow Up Recommendations  SNF;Supervision/Assistance - 24 hour     Equipment Recommendations  None recommended by PT    Recommendations for Other Services       Precautions / Restrictions Precautions Precautions: Fall Precaution Comments: sacral and right hip wound per RN notes Restrictions Weight Bearing Restrictions: No    Mobility  Bed Mobility Overal bed mobility: Needs Assistance Bed Mobility: Supine to Sit     Supine to sit: Min assist     General bed mobility comments: assist with LEs/facilitation of movement, pt then able to complete  Transfers Overall transfer level: Needs assistance   Transfers: Lateral/Scoot Transfers          Lateral/Scoot Transfers: Min guard General transfer comment: cues for safety, pt asks PT to "stand back". pt pushes RW away with initial attempt to stand, states he does not walk at home.   Ambulation/Gait                 Stairs             Wheelchair Mobility    Modified Rankin (Stroke Patients Only)       Balance                                            Cognition Arousal/Alertness: Awake/alert Behavior During Therapy: WFL for tasks assessed/performed Overall Cognitive Status: No family/caregiver present to determine baseline cognitive functioning Area of  Impairment: Problem solving;Safety/judgement;Following commands;Attention;Orientation                 Orientation Level: Disoriented to;Place;Time;Situation Current Attention Level: Focused   Following Commands: Follows one step commands inconsistently;Follows one step commands with increased time     Problem Solving: Slow processing;Decreased initiation;Difficulty sequencing;Requires verbal cues;Requires tactile cues General Comments: required multiple repetitions of cues for pt to initiate      Exercises      General Comments        Pertinent Vitals/Pain Pain Assessment: Faces Faces Pain Scale: Hurts little more Pain Location: "everywhere" Pain Descriptors / Indicators: Discomfort;Sore Pain Intervention(s): Limited activity within patient's tolerance;Monitored during session;Repositioned    Home Living                      Prior Function            PT Goals (current goals can now be found in the care plan section) Acute Rehab PT Goals Patient Stated Goal: pt did not state  PT Goal Formulation: With patient Time For Goal Achievement: 11/26/19 Potential to Achieve Goals: Fair Progress towards PT goals: Progressing toward goals    Frequency    Min 2X/week      PT Plan Current plan remains appropriate    Co-evaluation  AM-PAC PT "6 Clicks" Mobility   Outcome Measure  Help needed turning from your back to your side while in a flat bed without using bedrails?: A Little Help needed moving from lying on your back to sitting on the side of a flat bed without using bedrails?: A Little Help needed moving to and from a bed to a chair (including a wheelchair)?: A Little Help needed standing up from a chair using your arms (e.g., wheelchair or bedside chair)?: A Lot Help needed to walk in hospital room?: Total Help needed climbing 3-5 steps with a railing? : Total 6 Click Score: 13    End of Session Equipment Utilized During  Treatment: Gait belt Activity Tolerance: Patient tolerated treatment well Patient left: in chair;with chair alarm set;with call bell/phone within reach Nurse Communication: Mobility status PT Visit Diagnosis: Muscle weakness (generalized) (M62.81);Difficulty in walking, not elsewhere classified (R26.2)     Time: 1130-1150 PT Time Calculation (min) (ACUTE ONLY): 20 min  Charges:  $Therapeutic Activity: 8-22 mins                     Baxter Flattery, PT  Acute Rehab Dept ([place]) [phone] Pager [phone]  11/18/2019    [facility] 11/18/2019, 12:06 PM

## 2019-11-19 MED ORDER — FOLIC ACID 1 MG PO TABS
1.0000 mg | ORAL_TABLET | Freq: Every day | ORAL | Status: DC
  Administered 2019-11-20 – 2019-11-26 (×7): 1 mg via ORAL
  Filled 2019-11-19 (×7): qty 1

## 2019-11-19 MED ORDER — QUETIAPINE FUMARATE 50 MG PO TABS
50.0000 mg | ORAL_TABLET | Freq: Two times a day (BID) | ORAL | Status: DC
  Administered 2019-11-19 – 2019-11-26 (×14): 50 mg via ORAL
  Filled 2019-11-19 (×14): qty 1

## 2019-11-19 MED ORDER — JUVEN PO PACK
1.0000 | PACK | Freq: Two times a day (BID) | ORAL | Status: DC
  Administered 2019-11-19 – 2019-11-26 (×9): 1 via ORAL
  Filled 2019-11-19 (×15): qty 1

## 2019-11-19 NOTE — Progress Notes (Signed)
Nutrition Follow-up  DOCUMENTATION CODES:   Non-severe (moderate) malnutrition in context of social or environmental circumstances, Underweight  INTERVENTION:   -Recommend checking Phosphorus levels and replete if necessary.  -Ensure Enlive po BID, each supplement provides 350 kcal and 20 grams of protein  -Magic cup BID with meals, each supplement provides 290 kcal and 9 grams of protein  -Juven Fruit Punch BID, each serving provides 95kcal and 2.5g of protein (amino acids glutamine and arginine)  NUTRITION DIAGNOSIS:   Moderate Malnutrition related to social / environmental circumstances (ETOH abuse) as evidenced by percent weight loss, moderate fat depletion, moderate muscle depletion.  Ongoing.  GOAL:   Patient will meet greater than or equal to 90% of their needs  Progressing.  MONITOR:   PO intake, Supplement acceptance, Labs, Weight trends, I & O's, Skin  ASSESSMENT:   54 y.o. male with no known past medical history (never sees doctors) who was brought to the ED via EMS after church members found patient minimally responsive, on his couch in his own urine and feces where he had apparently not moved from for at least 2 weeks.  Patient currently consuming 100% of meals, is drinking Ensure supplements. Will add Juven at this time to aid in wound healing now that pt is eating adequately.   Admission weight: 119 lbs.   Labs reviewed. Medications: IV Folic acid, MAG-OX, Multivitamin with minerals daily, IV Thiamine, Vit B-12  Diet Order:   Diet Order            Diet regular Room service appropriate? Yes; Fluid consistency: Thin  Diet effective now                 EDUCATION NEEDS:   Not appropriate for education at this time  Skin:  Skin Assessment: Skin Integrity Issues: Skin Integrity Issues:: Unstageable, Stage II Stage II: sacrum Unstageable: right hip  Last BM:  6/28 -type 5  Height:   Ht Readings from Last 1 Encounters:  11/11/19 5\' 11"  (1.803  m)    Weight:   Wt Readings from Last 1 Encounters:  11/11/19 54.1 kg    Ideal Body Weight:     BMI:  Body mass index is 16.63 kg/m.  Estimated Nutritional Needs:   Kcal:  1700-1900  Protein:  85-100g  Fluid:  1.9L/day  11/13/19, MS, RD, LDN Inpatient Clinical Dietitian Contact information available via Amion

## 2019-11-19 NOTE — Progress Notes (Addendum)
[facility] PROGRESS NOTE   Randall OaksJohn E Garcia ZOX:[id number] DOB: 1966-01-17 DOA: 11/11/2019  PCP: Patient, No Pcp Per  Brief History/Interval Summary: 54 y.o. male with no known past medical history (never sees doctors) who was brought to the ED via EMS after church members found patient minimally responsive, on his couch in his own urine and feces where he had apparently not moved from for at least 2 weeks.    Patient was nonverbal at the time of initial presentation.  Apparently he lives with his brother who also is in a similar situation.  Apparently drinks a lot of alcohol.  Most of the history was provided by patient's pastor.    Reason for Visit: Acute metabolic encephalopathy.  Alcohol withdrawal syndrome  Consultants: Psychiatry  Procedures: None  Antibiotics: Anti-infectives (From admission, onward)   None      Subjective/Interval History: Patient noted to be somnolent this morning but easily arousable.  Discussed with nursing staff.  No new overnight issues.      Assessment/Plan:  Acute metabolic encephalopathy/catatonia/Wernicke's/alcohol withdrawal delirium Patient did not have any focal deficits.  CT head was negative for acute findings.  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 Patient was nonverbal in the emergency room. He did start speaking but noted to be very confused.  All of this appears to be secondary to Wernicke's.  Apparently drinks alcohol on a daily basis.  He likely also has nutritional deficiencies on top of that.  He was noted to have nystagmus. Seen by psychiatry and did not feel that he needed inpatient psychiatric care. Patient was started on high-dose thiamine.  He was also started on B12 supplementation.   Folic acid level was 6.1.  TSH was normal.  RPR nonreactive.  HIV nonreactive.  Ammonia level less than 9. Patient's mentation has improved but he still remains quite distracted. His alcohol withdrawal delirium seems to be better.    He likely has  underlying cognitive deficits not only due to nutritional issues but also due to possible vascular issues based on findings on CT scan.  He still remains disoriented.   Noted to be on Seroquel.  Will increase the dose.  Alcohol withdrawal syndrome in the setting of alcohol abuse Alcohol withdrawal symptoms appears to have improved.  Delirium is improving but he appears to have cognitive deficits which could be related to Wernicke's and B12 deficiency.    Mild rhabdomyolysis CK was noted to be 557.  Resolved with IV fluids.    Hypokalemia and hypomagnesemia Potassium has been normal the last 2 days.  Also on magnesium oxide.  Recheck labs tomorrow. Moderate Protein calorie malnutrition This is due to chronic alcoholism as well as poor oral intake.   Bacteremia, likely contaminant Bacillus species noted in 1 set.  He is afebrile.  His WBC is normal.  Likely a contaminant.  Continue to monitor for now.  No antibiotics at this time.  Normocytic anemia Drop in hemoglobin is noted.  Most likely dilutional.  Anemia panel reviewed.  Ferritin is 1368.  TIBC 121.  No evidence of overt bleeding.  Continue to monitor.    Vitamin B12 deficiency B12 level noted to be low normal at 264.  Started on supplementation.  Dehydration Received IV fluids.  Oral intake seems to have improved.  IV fluids discontinued.  Tobacco abuse Nicotine patch  Pressure injury Pressure Injury 11/11/19 Hip Right Unstageable - Full thickness tissue loss in which the base of the injury is covered by slough (yellow, tan, gray, green  or brown) and/or eschar (tan, brown or black) in the wound bed. (Active)  11/11/19 1815  Location: Hip  Location Orientation: Right  Staging: Unstageable - Full thickness tissue loss in which the base of the injury is covered by slough (yellow, tan, gray, green or brown) and/or eschar (tan, brown or black) in the wound bed.  Wound Description (Comments):   Present on Admission: Yes       Pressure Injury 11/11/19 Other (Comment) Left;Lateral Stage 2 -  Partial thickness loss of dermis presenting as a shallow open injury with a red, pink wound bed without slough. (Active)  11/11/19 1815  Location: Other (Comment)  Location Orientation: Left;Lateral  Staging: Stage 2 -  Partial thickness loss of dermis presenting as a shallow open injury with a red, pink wound bed without slough.  Wound Description (Comments):   Present on Admission: Yes   Skilled nursing facility was recommended by physical therapy.  Patient is uninsured.  He was apparently living with his brother.  They were living "off the grid."  Patient remains disoriented.  Even though medically he may be getting close to stability there is no safe discharge plan in place at this time.    DVT Prophylaxis: Lovenox Code Status: Full code Family Communication: No family at bedside Disposition Plan:  Status is: Inpatient  Remains inpatient appropriate because:Altered mental status   Dispo:  Patient From: Home  Planned Disposition: Skilled Nursing Facility  Expected discharge date: 11/20/19  Medically stable for discharge: No        Medications:  Scheduled: . enoxaparin (LOVENOX) injection  40 mg Subcutaneous Q24H  . feeding supplement (ENSURE ENLIVE)  237 mL Oral BID BM  . [START ON 11/20/2019] folic acid  1 mg Oral Daily  . magnesium oxide  400 mg Oral BID  . multivitamin with minerals  1 tablet Oral Daily  . nicotine  14 mg Transdermal Daily  . QUEtiapine  25 mg Oral BID  . [START ON 11/20/2019] thiamine  100 mg Oral Daily  . vitamin B-12  1,000 mcg Oral Daily   Continuous:  HBZ:JIRCVELFYBOFB **OR** acetaminophen, bisacodyl, hydrALAZINE, ondansetron **OR** ondansetron (ZOFRAN) IV, senna-docusate   Objective:  Vital Signs  Vitals:   11/17/19 2100 11/18/19 0641 11/18/19 1317 11/18/19 2027  BP: 115/74 111/74 101/82 124/77  Pulse: 89 (!) 102 (!) 110 (!) 110  Resp: 18 17 15 16   Temp: 98.4 F (36.9  C) 98.7 F (37.1 C) 98.8 F (37.1 C) 99.3 F (37.4 C)  TempSrc: Oral  Oral Oral  SpO2: 100% 98% 98% 98%  Weight:      Height:        Intake/Output Summary (Last 24 hours) at 11/19/2019 1101 Last data filed at 11/19/2019 0946 Gross per 24 hour  Intake 840 ml  Output 1600 ml  Net -760 ml   Filed Weights   11/11/19 1957  Weight: 54.1 kg    General appearance: Somnolent but easily arousable.  Remains distracted and disoriented. Resp: Clear to auscultation bilaterally.  Normal effort Cardio: S1-S2 is normal regular.  No S3-S4.  No rubs murmurs or bruit GI: Abdomen is soft.  Nontender nondistended.  Bowel sounds are present normal.  No masses organomegaly Extremities: No edema.  Full range of motion of lower extremities. Neurologic: No obvious focal deficits noted.   Lab Results:  Data Reviewed: I have personally reviewed following labs and imaging studies  CBC: Recent Labs  Lab 11/14/19 0555 11/15/19 11/17/19 11/16/19 0544 11/17/19 11/19/19 11/18/19 11/20/19  WBC 10.8* 5.6 5.0 5.0 5.8  HGB 8.9* 8.9* 9.2* 10.8* 12.9*  HCT 26.0* 27.3* 27.6* 33.3* 39.8  MCV 97.7 101.5* 100.0 100.6* 100.0  PLT 196 210 220 260 208    Basic Metabolic Panel: Recent Labs  Lab 11/14/19 0555 11/14/19 0555 11/14/19 1937 11/15/19 0554 11/16/19 0544 11/17/19 0642 11/18/19 0619  NA 133*  --   --  137 139 138 135  K 2.5*   < > 3.5 3.7 3.8 4.5 4.7  CL 99  --   --  104 105 101 99  CO2 25  --   --  27 27 28 27   GLUCOSE 100*  --   --  86 97 102* 96  BUN 5*  --   --  6 7 8 15   CREATININE 0.43*  --   --  0.51* 0.65 0.56* 0.66  CALCIUM 8.3*  --   --  8.4* 9.0 9.7 9.2  MG 1.5*  --   --  1.9 1.7 1.6* 1.8   < > = values in this interval not displayed.    GFR: Estimated Creatinine Clearance: 80.8 mL/min (by C-G formula based on SCr of 0.66 mg/dL).  Liver Function Tests: Recent Labs  Lab 11/12/19 1229 11/13/19 0722 11/14/19 0555  AST 43* 35 28  ALT 26 29 23   ALKPHOS 52 53 46  BILITOT 0.9 0.9 0.8   PROT 6.9 7.3 6.8  ALBUMIN 2.2* 2.4* 2.1*     Cardiac Enzymes: Recent Labs  Lab 11/12/19 1229 11/13/19 0722  CKTOTAL 299 141     Recent Results (from the past 240 hour(s))  Culture, blood (Routine X 2) w Reflex to ID Panel     Status: None   Collection Time: 11/11/19 11:44 AM   Specimen: BLOOD  Result Value Ref Range Status   Specimen Description   Final    BLOOD LEFT ANTECUBITAL Performed at [facility], [address] [place], [place]    Special Requests   Final    BOTTLES DRAWN AEROBIC AND ANAEROBIC Blood Culture adequate volume Performed at [facility], [address], [place], [place]    Culture   Final    NO GROWTH 5 DAYS Performed at [facility], [address] [place], [facility] [address]    Report Status 11/16/2019 FINAL  Final  SARS Coronavirus 2 by RT PCR (hospital order, performed in [facility] hospital lab) Nasopharyngeal Nasopharyngeal Swab     Status: None   Collection Time: 11/11/19  2:41 PM   Specimen: Nasopharyngeal Swab  Result Value Ref Range Status   SARS Coronavirus 2 NEGATIVE NEGATIVE Final    Comment: (NOTE) SARS-CoV-2 target nucleic acids are NOT DETECTED.  The SARS-CoV-2 RNA is generally detectable in upper and lower respiratory specimens during the acute phase of infection. The lowest concentration of SARS-CoV-2 viral copies this assay can detect is 250 copies / mL. A negative result does not preclude SARS-CoV-2 infection and should not be used as the sole basis for treatment or other patient management decisions.  A negative result may occur with improper specimen collection / handling, submission of specimen other than nasopharyngeal swab, presence of viral mutation(s) within the areas targeted by this assay, and inadequate number of viral copies (<250 copies / mL). A negative result must be combined with clinical observations, patient history, and epidemiological  information.  Fact Sheet for Patients:   11/18/2019  Fact Sheet for Healthcare Providers: [facility]  This  test is not yet approved or  cleared by the [place] and has been authorized for detection and/or diagnosis of SARS-CoV-2 by FDA under an Emergency Use Authorization (EUA).  This EUA will remain in effect (meaning this test can be used) for the duration of the COVID-19 declaration under Section 564(b)(1) of the Act, 21 U.S.C. section 360bbb-3(b)(1), unless the authorization is terminated or revoked sooner.  Performed at [facility], [address], [place], [place] [address]   Culture, blood (Routine X 2) w Reflex to ID Panel     Status: Abnormal   Collection Time: 11/11/19  2:55 PM   Specimen: BLOOD  Result Value Ref Range Status   Specimen Description   Final    BLOOD RIGHT ANTECUBITAL Performed at [facility], [address] [place], [place] [address]    Special Requests   Final    BOTTLES DRAWN AEROBIC AND ANAEROBIC Blood Culture adequate volume Performed at [facility], [address] [place], [place] [address]    Culture  Setup Time   Final    GRAM POSITIVE RODS AEROBIC BOTTLE ONLY CRITICAL RESULT CALLED TO, READ BACK BY AND VERIFIED WITH: PHARMD NICK  GLOGOVAC [id number] FCP     Culture (A)  Final    BACILLUS SPECIES Standardized susceptibility testing for this organism is not available. Performed at [facility], [address], [place], [place] [address]    Report Status 11/13/2019 FINAL  Final      Radiology Studies: No results found.     LOS: 7 days   Brahm Barbeau [employer] on [email or web address]  11/19/2019, 11:01 AM

## 2019-11-19 NOTE — Progress Notes (Signed)
   11/19/19 1432  Assess: MEWS Score  Temp 98.6 F (37 C)  BP 114/82  Pulse Rate (!) 111  Resp 16  Level of Consciousness Alert  SpO2 99 %  Assess: MEWS Score  MEWS Temp 0  MEWS Systolic 0  MEWS Pulse 2  MEWS RR 0  MEWS LOC 0  MEWS Score 2  MEWS Score Color Yellow  Assess: if the MEWS score is Yellow or Red  Were vital signs taken at a resting state? Yes  Focused Assessment Documented focused assessment  Early Detection of Sepsis Score *See Row Information* Low  MEWS guidelines implemented *See Row Information* No, previously yellow, continue vital signs every 4 hours  Treat  MEWS Interventions Other (Comment) (none at this time)  Notify: Charge Nurse/RN  Name of Charge Nurse/RN Notified Regina B RN  Date Charge Nurse/RN Notified 11/19/19  Time Charge Nurse/RN Notified 1435  Document  Patient Outcome Other (Comment) (pt at baseline and previously yellow MEWS for pulse)  Progress note created (see row info) Yes   Discussed with Hortencia Conradi.  Pt resting comfortably with no signs/symptoms of distress.  Previous yellow MEWS for pulse.  Will continue q4H vitals per protocol.

## 2019-11-19 NOTE — Progress Notes (Signed)
Occupational Therapy Treatment Patient Details Name: Randall Garcia MRN: [id number] DOB: 04/16/1966 Today's Date: 11/19/2019    History of present illness 54 y.o. male with no known past medical history (never sees doctors) who was brought to the ED via EMS after church members found patient minimally responsive, on his couch in his own urine and feces where he had apparently not moved from for at least 2 weeks. Diagnosis: acute met encephalopahty, rhabdomyolysis.   OT comments  This 54 yo male seen today for bed mobility, toilet transfers, toileting, following commands,and safety. He is moving better today(only requiring +1 A); however continues with decreased safety awareness. He will continue to benefit from acute OT with follow up at SNF.  Follow Up Recommendations  SNF;Supervision/Assistance - 24 hour    Equipment Recommendations  Other (comment) (TBD next venue)       Precautions / Restrictions Precautions Precautions: Fall Precaution Comments: sacral and right hip wound per RN notes Restrictions Weight Bearing Restrictions: No       Mobility Bed Mobility Overal bed mobility: Needs Assistance Bed Mobility: Supine to Sit     Supine to sit: Min assist     General bed mobility comments: assist with LEs/facilitation of movement, pt then able to complete  Transfers Overall transfer level: Needs assistance Equipment used: Rolling walker (2 wheeled) Transfers: Sit to/from Stand Sit to Stand: Min assist              Balance Overall balance assessment: Needs assistance Sitting-balance support: No upper extremity supported;Feet supported Sitting balance-Leahy Scale: Fair     Standing balance support: Bilateral upper extremity supported;During functional activity Standing balance-Leahy Scale: Poor                             ADL either performed or assessed with clinical judgement   ADL Overall ADL's : Needs assistance/impaired                          Toilet Transfer: Minimal assistance;Ambulation;Regular Toilet;Grab bars   Toileting- Clothing Manipulation and Hygiene: Minimal assistance;Sit to/from stand               Vision Patient Visual Report: No change from baseline            Cognition Arousal/Alertness: Lethargic Behavior During Therapy: Flat affect Overall Cognitive Status: No family/caregiver present to determine baseline cognitive functioning Area of Impairment: Safety/judgement                         Safety/Judgement: Decreased awareness of safety;Decreased awareness of deficits     General Comments: Felt like he could stand up without RW, he tried and could not, so he did use RW to help him, but then he kept needing VCs to keep RW with him and not keep trying to put it off to the side                   Pertinent Vitals/ Pain       Pain Assessment: Faces Faces Pain Scale: Hurts little more Pain Location: right ribs Pain Descriptors / Indicators: Discomfort;Sore Pain Intervention(s): Limited activity within patient's tolerance;Monitored during session;Repositioned     Prior Functioning/Environment              Frequency  Min 2X/week        Progress Toward Goals  OT Goals(current goals can  now be found in the care plan section)  Progress towards OT goals: Progressing toward goals     Plan Discharge plan remains appropriate       AM-PAC OT "6 Clicks" Daily Activity     Outcome Measure   Help from another person eating meals?: None Help from another person taking care of personal grooming?: A Little Help from another person toileting, which includes using toliet, bedpan, or urinal?: A Lot Help from another person bathing (including washing, rinsing, drying)?: A Little Help from another person to put on and taking off regular upper body clothing?: A Lot Help from another person to put on and taking off regular lower body clothing?: A Lot 6 Click Score: 16     End of Session Equipment Utilized During Treatment: Gait belt;Rolling walker  OT Visit Diagnosis: Unsteadiness on feet (R26.81);Muscle weakness (generalized) (M62.81);Other symptoms and signs involving cognitive function   Activity Tolerance Patient tolerated treatment well   Patient Left  In bathroom with NT supervising him.   Nurse Communication          Time: 2780-0447 OT Time Calculation (min): 21 min  Charges: OT General Charges $OT Visit: 1 Visit OT Treatments $Self Care/Home Management : 8-22 mins  [address]en Circle, OTR/L Acute [employer] Pager [phone] Office [phone]     Almon Register 11/19/2019, 11:51 AM

## 2019-11-20 DIAGNOSIS — E440 Moderate protein-calorie malnutrition: Secondary | ICD-10-CM

## 2019-11-20 DIAGNOSIS — F061 Catatonic disorder due to known physiological condition: Secondary | ICD-10-CM

## 2019-11-20 LAB — COMPREHENSIVE METABOLIC PANEL
ALT: 27 U/L (ref 0–44)
AST: 30 U/L (ref 15–41)
Albumin: 2.6 g/dL — ABNORMAL LOW (ref 3.5–5.0)
Alkaline Phosphatase: 44 U/L (ref 38–126)
Anion gap: 7 (ref 5–15)
BUN: 18 mg/dL (ref 6–20)
CO2: 26 mmol/L (ref 22–32)
Calcium: 9.2 mg/dL (ref 8.9–10.3)
Chloride: 103 mmol/L (ref 98–111)
Creatinine, Ser: 0.59 mg/dL — ABNORMAL LOW (ref 0.61–1.24)
GFR calc Af Amer: >60 mL/min (ref >60)
GFR calc non Af Amer: >60 mL/min (ref >60)
Glucose, Bld: 96 mg/dL (ref 70–99)
Potassium: 3.9 mmol/L (ref 3.5–5.1)
Sodium: 136 mmol/L (ref 135–145)
Total Bilirubin: 0.3 mg/dL (ref 0.3–1.2)
Total Protein: 7.4 g/dL (ref 6.5–8.1)

## 2019-11-20 LAB — CBC
HCT: 26 % — ABNORMAL LOW (ref 39.0–52.0)
Hemoglobin: 8.4 g/dL — ABNORMAL LOW (ref 13.0–17.0)
MCH: 32.8 pg (ref 26.0–34.0)
MCHC: 32.3 g/dL (ref 30.0–36.0)
MCV: 101.6 fL — ABNORMAL HIGH (ref 80.0–100.0)
Platelets: 245 10*3/uL (ref 150–400)
RBC: 2.56 MIL/uL — ABNORMAL LOW (ref 4.22–5.81)
RDW: 13.6 % (ref 11.5–15.5)
WBC: 6.5 10*3/uL (ref 4.0–10.5)
nRBC: 0 % (ref 0.0–0.2)

## 2019-11-20 LAB — MAGNESIUM: Magnesium: 1.7 mg/dL (ref 1.7–2.4)

## 2019-11-20 MED ORDER — MAGNESIUM SULFATE 2 GM/50ML IV SOLN
2.0000 g | Freq: Once | INTRAVENOUS | Status: AC
  Administered 2019-11-20: 2 g via INTRAVENOUS
  Filled 2019-11-20: qty 50

## 2019-11-20 NOTE — Progress Notes (Signed)
Physical Therapy Treatment Patient Details Name: Randall Garcia MRN: [id number] DOB: 1965/10/20 Today's Date: 11/20/2019    History of Present Illness 54 y.o. male with no known past medical history (never sees doctors) who was brought to the ED via EMS after church members found patient minimally responsive, on his couch in his own urine and feces where he had apparently not moved from for at least 2 weeks. Diagnosis: acute met encephalopahty, rhabdomyolysis.    PT Comments    Supine to sit with supervision, min assist for sit to stand and to ambulate 4' with RW. Standing tolerance limited by dizziness. BP sitting 108/80 with HR 113, BP standing 87/63 with HR 124. Pt is high risk for falls and has decreased safety awareness. 24* assistance recommended.    Follow Up Recommendations  SNF;Supervision/Assistance - 24 hour;Supervision for mobility/OOB     Equipment Recommendations  None recommended by PT    Recommendations for Other Services       Precautions / Restrictions Precautions Precautions: Fall Precaution Comments: sacral and right hip wound per RN notes Restrictions Weight Bearing Restrictions: No    Mobility  Bed Mobility Overal bed mobility: Needs Assistance Bed Mobility: Supine to Sit     Supine to sit: Supervision Sit to supine: Supervision;HOB elevated   General bed mobility comments: used rail, HOB up  Transfers Overall transfer level: Needs assistance Equipment used: Rolling walker (2 wheeled) Transfers: Sit to/from Stand Sit to Stand: Min assist;From elevated surface         General transfer comment: VCs for safety/hand placement, sit to stand x 2, pt reported dizziness in standing, BP standing 87/63, sitting 108/80  Ambulation/Gait Ambulation/Gait assistance: Min assist Gait Distance (Feet): 4 Feet Assistive device: Rolling walker (2 wheeled) Gait Pattern/deviations: Narrow base of support;Step-through pattern;Decreased stride length      General Gait Details: bed to recliner with RW, distance limited by dizziness, no loss of balance   Stairs             Wheelchair Mobility    Modified Rankin (Stroke Patients Only)       Balance                                            Cognition Arousal/Alertness: Lethargic Behavior During Therapy: Flat affect Overall Cognitive Status: No family/caregiver present to determine baseline cognitive functioning Area of Impairment: Safety/judgement                     Memory: Decreased short-term memory Following Commands: Follows one step commands inconsistently;Follows one step commands with increased time Safety/Judgement: Decreased awareness of safety;Decreased awareness of deficits   Problem Solving: Slow processing;Decreased initiation;Difficulty sequencing;Requires verbal cues;Requires tactile cues General Comments: increased time for all mobility, increased time to process commands, pt stated he was walking at home 3-4 days ago but he's been in the hospital 9 days      Exercises      General Comments        Pertinent Vitals/Pain Pain Location: "my whole left side" Pain Descriptors / Indicators:  (when asked to rate pain, pt stated, "I couldn't tell you that". Pt reports he injured L side in tree cutting accident many years ago.) Pain Intervention(s): Limited activity within patient's tolerance;Monitored during session    Home Living  Prior Function            PT Goals (current goals can now be found in the care plan section) Acute Rehab PT Goals Patient Stated Goal: pt did not state  PT Goal Formulation: With patient Time For Goal Achievement: 11/26/19 Potential to Achieve Goals: Fair Progress towards PT goals: Progressing toward goals    Frequency    Min 2X/week      PT Plan Current plan remains appropriate    Co-evaluation              AM-PAC PT "6 Clicks" Mobility    Outcome Measure  Help needed turning from your back to your side while in a flat bed without using bedrails?: A Little Help needed moving from lying on your back to sitting on the side of a flat bed without using bedrails?: A Little Help needed moving to and from a bed to a chair (including a wheelchair)?: A Little Help needed standing up from a chair using your arms (e.g., wheelchair or bedside chair)?: A Lot Help needed to walk in hospital room?: A Lot Help needed climbing 3-5 steps with a railing? : Total 6 Click Score: 14    End of Session Equipment Utilized During Treatment: Gait belt Activity Tolerance: Treatment limited secondary to medical complications (Comment) (orthostatic) Patient left: in chair;with chair alarm set;with call bell/phone within reach Nurse Communication: Mobility status (pt orthostatic) PT Visit Diagnosis: Muscle weakness (generalized) (M62.81);Difficulty in walking, not elsewhere classified (R26.2)     Time: 0034-9179 PT Time Calculation (min) (ACUTE ONLY): 26 min  Charges:  $Therapeutic Activity: 23-37 mins                    Blondell Reveal Kistler PT 11/20/2019  Acute Rehabilitation Services Pager [phone] Office [phone]

## 2019-11-20 NOTE — Progress Notes (Signed)
PROGRESS NOTE    Randall Garcia  TAV:[id number] DOB: 15-Oct-1965 DOA: 11/11/2019 PCP: Patient, No Pcp Per   Brief Narrative: The patient is a 54 year old Caucasian disheveled male with no known past medical history as he is not see prior physicians who was brought to the ED via EMS after church members found him minimally responsive on his couch in his own urine and feces.  He had not moved from his couch for apparently 2 weeks and nonverbal at the time of his initial presentation.  He and his brother live in the same place and his brother had a similar situation.  Apparently drinks a lot of alcohol and most of the history was provided by the patient's pastor and who presented with acute metabolic encephalopathy which is now improved.  Psychiatry was consulted as well.  Wernicke's and alcohol withdrawal delirium and he is improving but still remains intermittently confused.  PT OT recommending SNF however patient is uninsured and currently has no safe discharge disposition she will remain in the hospital at this time.  Assessment & Plan:   Principal Problem:   Acute metabolic encephalopathy Active Problems:   Alcohol abuse   Rhabdomyolysis   Hypomagnesemia   Catatonia   Dehydration   Pressure injury of skin   Malnutrition of moderate degree  Acute metabolic encephalopathy/catatonia/Wernicke's/alcohol withdrawal delirium -Patient did not have any focal deficits.   -CT head was negative for acute findings.  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 -Patient was nonverbal in the emergency room. He did start speaking but noted to be very confused.  All of this appears to be secondary to Wernicke's.  Apparently drinks alcohol on a daily basis.  He likely also has nutritional deficiencies on top of that.  He was noted to have nystagmus -Seen by psychiatry and did not feel that he needed inpatient psychiatric care. -Patient was started on high-dose thiamine.  He was also started on B12 supplementation.   -Folic  acid level was 6.1.  TSH was normal.  RPR nonreactive.  HIV nonreactive.  Ammonia level less than 9. -Patient's mentation has improved but he still remains quite distracted. His alcohol withdrawal delirium seems to be better.    He likely has underlying cognitive deficits not only due to nutritional issues but also due to possible vascular issues based on findings on CT scan.  He still remains disoriented.   -Noted to be on Quetiapine and his dose has now increased to 50 mg po BID -PT/OT recommending SNF; TOC assisting with placement but has no safe options for D/C currently given that he is unisured  Alcohol withdrawal syndrome in the setting of alcohol abuse -Alcohol withdrawal symptoms appears to have improved.  -Delirium is improving but he appears to have cognitive deficits which could be related to Wernicke's and B12 deficiency.    Mild Rhabdomyolysis -CK was noted to be 557.  Resolved with IV fluids.    Hypokalemia and Hypomagnesemia -Potassium has been normal the last 4 days.   -Also on magnesium oxide but will give IV Mas Sulfate today given Mag Level of 1.7 -Continue to monitor and trend magnesium level as well as CMP -Repeat CMP and magnesium level in a.m.  Bacteremia, likely contaminant -Bacillus species noted in 1 set.  He is afebrile.  His WBC is normal and he is afrebile.   -Likely a contaminant.  Continue to monitor for now.  No antibiotics at this time.  Normocytic/Macrocytic Anemia -Drop in hemoglobin is noted and had been improving but  dropped again.  Dr. Jones Skene dilutional but hemoglobin/hematocrit went from 12.9/39.8 and now dropped to 8.4/26.0  -Anemia panel done on 11/14/2019 and showed an iron level of 18, U IBC of 103, TIBC of 121, saturation ratios of 15%, ferritin level of 1368, folate level 6.1 -Continue to monitor for signs and symptoms bleeding; currently no overt bleeding noted -Repeat CBC in the AM -Continues to worsen will obtain FOBT  Vitamin B12  Deficiency -B12 level noted to be low normal at 264.  Started on supplementation.  Dehydration -Received IV fluids.   -Oral intake seems to have improved.  IV fluids discontinued.  Tobacco Abuse -Smoking Cessation counseling given -C/w Nicotine patch  Pressure injury, poA Pressure Injury 11/11/19 Hip Right Unstageable - Full thickness tissue loss in which the base of the injury is covered by slough (yellow, tan, gray, green or brown) and/or eschar (tan, brown or black) in the wound bed. (Active)  11/11/19 1815  Location: Hip  Location Orientation: Right  Staging: Unstageable - Full thickness tissue loss in which the base of the injury is covered by slough (yellow, tan, gray, green or brown) and/or eschar (tan, brown or black) in the wound bed.  Wound Description (Comments):   Present on Admission: Yes     Pressure Injury 11/11/19 Other (Comment) Left;Lateral Stage 2 -  Partial thickness loss of dermis presenting as a shallow open injury with a red, pink wound bed without slough. (Active)  11/11/19 1815  Location: Other (Comment)  Location Orientation: Left;Lateral  Staging: Stage 2 -  Partial thickness loss of dermis presenting as a shallow open injury with a red, pink wound bed without slough.  Wound Description (Comments):   Present on Admission: Yes   Nonsevere/Moderate malnutrition and Underweight  -In the setting of Chronic Alcoholism and poor po intake  -Nutritionist consulted for further evaluation recommendations Continue with Ensure alive p.o. twice daily, Magic cup twice daily with meals, Juven fruit punch twice daily and will check magnesium and phosphorus level in a.m.  DVT prophylaxis: Enoxaparin 40 mg sq q24h Code Status: FULL CODE  Family Communication: No family present at bedside  Disposition Plan:   Status is: Inpatient  Remains inpatient appropriate because:Unsafe d/c plan and Inpatient level of care appropriate due to severity of illness  Dispo:  Patient  From: Home  Planned Disposition: To be determined; Unsafe Discharge Disposition  Expected discharge date: 11/22/19  Medically stable for discharge: YES  Consultants:   Psychiatry    Procedures:  None  Antimicrobials:  Anti-infectives (From admission, onward)   None     Subjective: Seen and examined at bedside he is doing okay.  Complaining of pain "everywhere".  No nausea or vomiting.  No other concerns or complaints at this time.  Tolerating his breakfast without issues.  Objective: Vitals:   11/19/19 1432 11/19/19 2203 11/20/19 0601 11/20/19 1215  BP: 114/82 110/71 117/79 128/82  Pulse: (!) 111 (!) 109 (!) 108 (!) 106  Resp: 16 17 17 18   Temp: 98.6 F (37 C) 98.8 F (37.1 C) 99.2 F (37.3 C) 98.4 F (36.9 C)  TempSrc: Oral Oral Oral Oral  SpO2: 99% 97% 95% 97%  Weight:      Height:        Intake/Output Summary (Last 24 hours) at 11/20/2019 1219 Last data filed at 11/20/2019 1009 Gross per 24 hour  Intake 720 ml  Output 750 ml  Net -30 ml   Filed Weights   11/11/19 1957  Weight: 54.1 kg  Examination: Physical Exam:  Constitutional: Thin disheveled Caucasian male currently in NAD and appears calm Eyes: Lids and conjunctivae normal, sclerae anicteric  ENMT: External Ears, Nose appear normal. Grossly normal hearing.  Neck: Appears normal, supple, no cervical masses, normal ROM, no appreciable thyromegaly; no JVD Respiratory: Diminished to auscultation bilaterally, no wheezing, rales, rhonchi or crackles. Normal respiratory effort and patient is not tachypenic. No accessory muscle use.  Unlabored breathing Cardiovascular: RRR, no murmurs / rubs / gallops. S1 and S2 auscultated. No extremity edema.  Abdomen: Soft, non-tender, non-distended.  Bowel sounds positive.  GU: Deferred. Musculoskeletal: No clubbing / cyanosis of digits/nails. No joint deformity upper and lower extremities.  Skin: No rashes noted on a limited skin evaluation.  Does have some pressure  ulcers as above. no induration; Warm and dry.  Neurologic: CN 2-12 grossly intact with no focal deficits. Romberg sign and cerebellar reflexes not assessed.  Psychiatric: Slightly impaired judgment and insight. Alert but remains easily distracted slightly disoriented.. Normal mood and appropriate affect.   Data Reviewed: I have personally reviewed following labs and imaging studies  CBC: Recent Labs  Lab 11/15/19 0554 11/16/19 0544 11/17/19 0642 11/18/19 0619 11/20/19 0557  WBC 5.6 5.0 5.0 5.8 6.5  HGB 8.9* 9.2* 10.8* 12.9* 8.4*  HCT 27.3* 27.6* 33.3* 39.8 26.0*  MCV 101.5* 100.0 100.6* 100.0 101.6*  PLT 210 220 260 208 245   Basic Metabolic Panel: Recent Labs  Lab 11/15/19 0554 11/16/19 0544 11/17/19 0642 11/18/19 0619 11/20/19 0557  NA 137 139 138 135 136  K 3.7 3.8 4.5 4.7 3.9  CL 104 105 101 99 103  CO2 27 27 28 27 26   GLUCOSE 86 97 102* 96 96  BUN 6 7 8 15 18   CREATININE 0.51* 0.65 0.56* 0.66 0.59*  CALCIUM 8.4* 9.0 9.7 9.2 9.2  MG 1.9 1.7 1.6* 1.8 1.7   GFR: Estimated Creatinine Clearance: 80.8 mL/min (A) (by C-G formula based on SCr of 0.59 mg/dL (L)). Liver Function Tests: Recent Labs  Lab 11/14/19 0555 11/20/19 0557  AST 28 30  ALT 23 27  ALKPHOS 46 44  BILITOT 0.8 0.3  PROT 6.8 7.4  ALBUMIN 2.1* 2.6*   No results for input(s): LIPASE, AMYLASE in the last 168 hours. No results for input(s): AMMONIA in the last 168 hours. Coagulation Profile: No results for input(s): INR, PROTIME in the last 168 hours. Cardiac Enzymes: No results for input(s): CKTOTAL, CKMB, CKMBINDEX, TROPONINI in the last 168 hours. BNP (last 3 results) No results for input(s): PROBNP in the last 8760 hours. HbA1C: No results for input(s): HGBA1C in the last 72 hours. CBG: No results for input(s): GLUCAP in the last 168 hours. Lipid Profile: No results for input(s): CHOL, HDL, LDLCALC, TRIG, CHOLHDL, LDLDIRECT in the last 72 hours. Thyroid Function Tests: No results for  input(s): TSH, T4TOTAL, FREET4, T3FREE, THYROIDAB in the last 72 hours. Anemia Panel: No results for input(s): VITAMINB12, FOLATE, FERRITIN, TIBC, IRON, RETICCTPCT in the last 72 hours. Sepsis Labs: No results for input(s): PROCALCITON, LATICACIDVEN in the last 168 hours.  Recent Results (from the past 240 hour(s))  Culture, blood (Routine X 2) w Reflex to ID Panel     Status: None   Collection Time: 11/11/19 11:44 AM   Specimen: BLOOD  Result Value Ref Range Status   Specimen Description   Final    BLOOD LEFT ANTECUBITAL Performed at [facility], [address], [place], [place]    Special Requests  Final    BOTTLES DRAWN AEROBIC AND ANAEROBIC Blood Culture adequate volume Performed at [facility], [address] [place], [place] [address]    Culture   Final    NO GROWTH 5 DAYS Performed at [facility], [address] [place], [place] [address]    Report Status 11/16/2019 FINAL  Final  SARS Coronavirus 2 by RT PCR (hospital order, performed in [facility] hospital lab) Nasopharyngeal Nasopharyngeal Swab     Status: None   Collection Time: 11/11/19  2:41 PM   Specimen: Nasopharyngeal Swab  Result Value Ref Range Status   SARS Coronavirus 2 NEGATIVE NEGATIVE Final    Comment: (NOTE) SARS-CoV-2 target nucleic acids are NOT DETECTED.  The SARS-CoV-2 RNA is generally detectable in upper and lower respiratory specimens during the acute phase of infection. The lowest concentration of SARS-CoV-2 viral copies this assay can detect is 250 copies / mL. A negative result does not preclude SARS-CoV-2 infection and should not be used as the sole basis for treatment or other patient management decisions.  A negative result may occur with improper specimen collection / handling, submission of specimen other than nasopharyngeal swab, presence of viral mutation(s) within the areas targeted by this assay, and inadequate number of viral  copies (<250 copies / mL). A negative result must be combined with clinical observations, patient history, and epidemiological information.  Fact Sheet for Patients:   BoilerBrush.com.cyhttps://www.fda.gov/media/136312/download  Fact Sheet for Healthcare Providers: https://pope.com/https://www.fda.gov/media/136313/download  This test is not yet approved or  cleared by the [place] FDA and has been authorized for detection and/or diagnosis of SARS-CoV-2 by FDA under an Emergency Use Authorization (EUA).  This EUA will remain in effect (meaning this test can be used) for the duration of the COVID-19 declaration under Section 564(b)(1) of the Act, 21 U.S.C. section 360bbb-3(b)(1), unless the authorization is terminated or revoked sooner.  Performed at [facility], [address] [place], [place] [address]   Culture, blood (Routine X 2) w Reflex to ID Panel     Status: Abnormal   Collection Time: 11/11/19  2:55 PM   Specimen: BLOOD  Result Value Ref Range Status   Specimen Description   Final    BLOOD RIGHT ANTECUBITAL Performed at [facility], [address] [place], [place] [address]    Special Requests   Final    BOTTLES DRAWN AEROBIC AND ANAEROBIC Blood Culture adequate volume Performed at [facility], [address] [place], [place] [address]    Culture  Setup Time   Final    GRAM POSITIVE RODS AEROBIC BOTTLE ONLY CRITICAL RESULT CALLED TO, READ BACK BY AND VERIFIED WITH: PHARMD NICK  GLOGOVAC [id number] FCP     Culture (A)  Final    BACILLUS SPECIES Standardized susceptibility testing for this organism is not available. Performed at [facility], [address] [place], [place] [address]    Report Status 11/13/2019 FINAL  Final    RN Pressure Injury Documentation: Pressure Injury 11/11/19 Hip Right Unstageable - Full thickness tissue loss in which the base of the injury is covered by slough (yellow, tan, gray, green or brown)  and/or eschar (tan, brown or black) in the wound bed. (Active)  11/11/19 1815  Location: Hip  Location Orientation: Right  Staging: Unstageable - Full thickness tissue loss in which the base of the injury is covered by slough (yellow, tan, gray, green or brown) and/or eschar (tan, brown  or black) in the wound bed.  Wound Description (Comments):   Present on Admission: Yes     Pressure Injury 11/11/19 Other (Comment) Left;Lateral Stage 2 -  Partial thickness loss of dermis presenting as a shallow open injury with a red, pink wound bed without slough. (Active)  11/11/19 1815  Location: Other (Comment)  Location Orientation: Left;Lateral  Staging: Stage 2 -  Partial thickness loss of dermis presenting as a shallow open injury with a red, pink wound bed without slough.  Wound Description (Comments):   Present on Admission: Yes   Estimated body mass index is 16.63 kg/m as calculated from the following:   Height as of this encounter:  (1.803 m).   Weight as of this encounter: 54.1 kg.  Malnutrition Type:  Nutrition Problem: Moderate Malnutrition Etiology: social / environmental circumstances (ETOH abuse)   Malnutrition Characteristics:  Signs/Symptoms: percent weight loss, moderate fat depletion, moderate muscle depletion Percent weight loss: 22 % (x 9 months)   Nutrition Interventions:  Interventions: Ensure Enlive (each supplement provides 350kcal and 20 grams of protein), Magic cup   Radiology Studies: No results found.  Scheduled Meds:  enoxaparin (LOVENOX) injection  40 mg Subcutaneous Q24H   feeding supplement (ENSURE ENLIVE)  237 mL Oral BID BM   folic acid  1 mg Oral Daily   magnesium oxide  400 mg Oral BID   multivitamin with minerals  1 tablet Oral Daily   nicotine  14 mg Transdermal Daily   nutrition supplement (JUVEN)  1 packet Oral BID BM   QUEtiapine  50 mg Oral BID   thiamine  100 mg Oral Daily   vitamin B-12  1,000 mcg Oral Daily    Continuous Infusions:   LOS: 8 days   Merlene Laughter, DO [facility] PAGER is on AMION  If 7PM-7AM, please contact night-coverage [email or web address]

## 2019-11-21 LAB — CBC WITH DIFFERENTIAL/PLATELET
Abs Immature Granulocytes: 0.06 10*3/uL (ref 0.00–0.07)
Basophils Absolute: 0 10*3/uL (ref 0.0–0.1)
Basophils Relative: 0 %
Eosinophils Absolute: 0.1 10*3/uL (ref 0.0–0.5)
Eosinophils Relative: 2 %
HCT: 26.1 % — ABNORMAL LOW (ref 39.0–52.0)
Hemoglobin: 8.5 g/dL — ABNORMAL LOW (ref 13.0–17.0)
Immature Granulocytes: 1 %
Lymphocytes Relative: 27 %
Lymphs Abs: 1.8 10*3/uL (ref 0.7–4.0)
MCH: 32.7 pg (ref 26.0–34.0)
MCHC: 32.6 g/dL (ref 30.0–36.0)
MCV: 100.4 fL — ABNORMAL HIGH (ref 80.0–100.0)
Monocytes Absolute: 0.9 10*3/uL (ref 0.1–1.0)
Monocytes Relative: 14 %
Neutro Abs: 3.7 10*3/uL (ref 1.7–7.7)
Neutrophils Relative %: 56 %
Platelets: 253 10*3/uL (ref 150–400)
RBC: 2.6 MIL/uL — ABNORMAL LOW (ref 4.22–5.81)
RDW: 13.5 % (ref 11.5–15.5)
WBC: 6.6 10*3/uL (ref 4.0–10.5)
nRBC: 0 % (ref 0.0–0.2)

## 2019-11-21 LAB — COMPREHENSIVE METABOLIC PANEL
ALT: 32 U/L (ref 0–44)
AST: 36 U/L (ref 15–41)
Albumin: 2.8 g/dL — ABNORMAL LOW (ref 3.5–5.0)
Alkaline Phosphatase: 47 U/L (ref 38–126)
Anion gap: 8 (ref 5–15)
BUN: 16 mg/dL (ref 6–20)
CO2: 25 mmol/L (ref 22–32)
Calcium: 9.4 mg/dL (ref 8.9–10.3)
Chloride: 102 mmol/L (ref 98–111)
Creatinine, Ser: 0.65 mg/dL (ref 0.61–1.24)
GFR calc Af Amer: >60 mL/min (ref >60)
GFR calc non Af Amer: >60 mL/min (ref >60)
Glucose, Bld: 90 mg/dL (ref 70–99)
Potassium: 4 mmol/L (ref 3.5–5.1)
Sodium: 135 mmol/L (ref 135–145)
Total Bilirubin: 0.4 mg/dL (ref 0.3–1.2)
Total Protein: 7.9 g/dL (ref 6.5–8.1)

## 2019-11-21 LAB — MAGNESIUM: Magnesium: 1.9 mg/dL (ref 1.7–2.4)

## 2019-11-21 LAB — PHOSPHORUS: Phosphorus: 3.8 mg/dL (ref 2.5–4.6)

## 2019-11-21 NOTE — TOC Initial Note (Signed)
Transition of Care [facility]) - Initial/Assessment Note    Patient Details  Name: Randall Garcia MRN: [id number] Date of Birth: 04-24-1966  [facility] CM/SW Contact:    Bartholome Bill, RN Phone Number: 11/21/2019, 10:07 AM  Clinical Narrative:                 This CM spoke with pt at bedside yesterday and today about dc planning. Pt continues to refuse SNF placement and wants to go back home with his brother. MD to consult psych for capacity eval. TOC will continue to follow.  Expected Discharge Plan: Home/Self Care Barriers to Discharge: Unsafe home situation  Expected Discharge Plan and Services Expected Discharge Plan: Home/Self Care   Discharge Planning Services: CM Consult   Living arrangements for the past 2 months: Single Family Home   Prior Living Arrangements/Services Living arrangements for the past 2 months: Single Family Home Lives with:: Siblings Patient language and need for interpreter reviewed:: Yes Do you feel safe going back to the place where you live?: Yes      Need for Family Participation in Patient Care: Yes (Comment) Care giver support system in place?: No (comment)   Criminal Activity/Legal Involvement Pertinent to Current Situation/Hospitalization: No - Comment as needed  Activities of Daily Living Home Assistive Devices/Equipment: Other (Comment) (pt unable to answer) ADL Screening (condition at time of admission) Patient's cognitive ability adequate to safely complete daily activities?: No Is the patient deaf or have difficulty hearing?: No (unknown) Does the patient have difficulty seeing, even when wearing glasses/contacts?: No (unknown, pt unable to answer) Does the patient have difficulty concentrating, remembering, or making decisions?: Yes Patient able to express need for assistance with ADLs?: No Does the patient have difficulty dressing or bathing?: Yes Independently performs ADLs?: No Communication: Needs assistance Is this a  change from baseline?: Pre-admission baseline Dressing (OT): Needs assistance Is this a change from baseline?: Pre-admission baseline Grooming: Needs assistance Is this a change from baseline?: Pre-admission baseline Feeding: Needs assistance Is this a change from baseline?: Pre-admission baseline Bathing: Needs assistance Is this a change from baseline?: Pre-admission baseline Toileting: Needs assistance Is this a change from baseline?: Pre-admission baseline In/Out Bed: Needs assistance Is this a change from baseline?: Pre-admission baseline Walks in Home: Needs assistance Is this a change from baseline?: Pre-admission baseline Does the patient have difficulty walking or climbing stairs?: Yes Weakness of Legs: Both Weakness of Arms/Hands: Both  Permission Sought/Granted                  Emotional Assessment Appearance:: Appears older than stated age Attitude/Demeanor/Rapport: Avoidant Affect (typically observed): Restless   Alcohol / Substance Use: Alcohol Use Psych Involvement: Yes (comment)  Admission diagnosis:  Dehydration [E86.0] Acute metabolic encephalopathy [G93.41] Patient Active Problem List   Diagnosis Date Noted  . Pressure injury of skin 11/12/2019  . Malnutrition of moderate degree 11/12/2019  . Rhabdomyolysis 11/11/2019  . Hypokalemia 11/11/2019  . Hypomagnesemia 11/11/2019  . Acute metabolic encephalopathy 11/11/2019  . Catatonia 11/11/2019  . Dehydration 11/11/2019  . Left arm cellulitis 02/17/2019  . Hyponatremia 02/17/2019  . Alcohol abuse 02/17/2019   PCP:  Patient, No Pcp Per Pharmacy:   [facility] [place], [place] - [address] [place] [address] Phone: [phone] Fax: [phone]  [id number] - [place], [place] - [address] AT [facility] [address] [place] [address]  Phone: [phone] Fax: [phone]     Social  Determinants of Health (SDOH) Interventions    Readmission Risk Interventions No flowsheet data found.

## 2019-11-21 NOTE — Progress Notes (Signed)
PROGRESS NOTE    Randall Garcia  NGE:[id number] DOB: [birth year] DOA: 11/11/2019 PCP: Patient, No Pcp Per   Brief Narrative: The patient is a 54 year old Caucasian disheveled male with no known past medical history as he is not see prior physicians who was brought to the ED via EMS after church members found him minimally responsive on his couch in his own urine and feces.  He had not moved from his couch for apparently 2 weeks and nonverbal at the time of his initial presentation.  He and his brother live in the same place and his brother had a similar situation.  Apparently drinks a lot of alcohol and most of the history was provided by the patient's pastor and who presented with acute metabolic encephalopathy which is now improved.  Psychiatry was consulted as well.  Wernicke's and alcohol withdrawal delirium and he is improving but still remains intermittently confused.  PT OT recommending SNF however patient is uninsured and currently has no safe discharge disposition she will remain in the hospital at this time.  Assessment & Plan:   Principal Problem:   Acute metabolic encephalopathy Active Problems:   Alcohol abuse   Rhabdomyolysis   Hypomagnesemia   Catatonia   Dehydration   Pressure injury of skin   Malnutrition of moderate degree  Acute metabolic encephalopathy/catatonia/Wernicke's/alcohol withdrawal delirium -Patient did not have any focal deficits.   -CT head was negative for acute findings.  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 -Patient was nonverbal in the emergency room. He did start speaking but noted to be very confused.  All of this appears to be secondary to Wernicke's.  Apparently drinks alcohol on a daily basis.  He likely also has nutritional deficiencies on top of that.  He was noted to have nystagmus -Seen by psychiatry and did not feel that he needed inpatient psychiatric care. -Patient was started on high-dose thiamine.  He was also started on B12 supplementation.   -Folic  acid level was 6.1.  TSH was normal.  RPR nonreactive.  HIV nonreactive.  Ammonia level less than 9. -Patient's mentation has improved but he still remains quite distracted. His alcohol withdrawal delirium seems to be better.    He likely has underlying cognitive deficits not only due to nutritional issues but also due to possible vascular issues based on findings on CT scan.  He still remains disoriented.   -Noted to be on Quetiapine and his dose has now increased to 50 mg po BID -PT/OT recommending SNF; TOC assisting with placement but has no safe options for D/C currently given that he is unisured -Patient refusing SNF and wanting to go home but has been intermittently confused  -Will get Psychiatry to evaluate for Capacity Evaluation  Alcohol withdrawal syndrome in the setting of alcohol abuse -Alcohol withdrawal symptoms appears to have improved.  -Delirium is improving but he appears to have cognitive deficits which could be related to Wernicke's and B12 deficiency.    Mild Rhabdomyolysis -CK was noted to be 557.  Resolved with IV fluids.    Hypokalemia and Hypomagnesemia -Potassium has been normal the last 4 days and today was 4.0   -Also on magnesium oxide -Given Mag Sulfate IV 2 grams yesterday and now improved to 1.9 -Continue to monitor and trend magnesium level as well as CMP -Repeat CMP and magnesium level intermittently   Bacteremia, likely contaminant -Bacillus species noted in 1 set.  He is afebrile.  His WBC is normal and he is afrebile.   -Likely a  contaminant.  Continue to monitor for now.  No antibiotics at this time.  Normocytic/Macrocytic Anemia -Drop in hemoglobin is noted and had been improving but dropped again.  Likely dilutional but hemoglobin/hematocrit went from 12.9/39.8 and now dropped to 8.4/26.0 and today is 8.5/26.1 -Anemia panel done on 11/14/2019 and showed an iron level of 18, U IBC of 103, TIBC of 121, saturation ratios of 15%, ferritin level of 1368,  folate level 6.1 -Continue to monitor for signs and symptoms bleeding; currently no overt bleeding noted -Repeat CBC in the AM -Continues to worsen will obtain FOBT  Vitamin B12 Deficiency -B12 level noted to be low normal at 264.  Started on supplementation.  Dehydration -Received IV fluids.   -Oral intake seems to have improved.  IV fluids discontinued.  Tobacco Abuse -Smoking Cessation counseling given -C/w Nicotine patch  Pressure injury, poA Pressure Injury 11/11/19 Hip Right Unstageable - Full thickness tissue loss in which the base of the injury is covered by slough (yellow, tan, gray, green or brown) and/or eschar (tan, brown or black) in the wound bed. (Active)  11/11/19 1815  Location: Hip  Location Orientation: Right  Staging: Unstageable - Full thickness tissue loss in which the base of the injury is covered by slough (yellow, tan, gray, green or brown) and/or eschar (tan, brown or black) in the wound bed.  Wound Description (Comments):   Present on Admission: Yes     Pressure Injury 11/11/19 Other (Comment) Left;Lateral Stage 2 -  Partial thickness loss of dermis presenting as a shallow open injury with a red, pink wound bed without slough. (Active)  11/11/19 1815  Location: Other (Comment)  Location Orientation: Left;Lateral  Staging: Stage 2 -  Partial thickness loss of dermis presenting as a shallow open injury with a red, pink wound bed without slough.  Wound Description (Comments):   Present on Admission: Yes   Nonsevere/Moderate malnutrition and Underweight  -In the setting of Chronic Alcoholism and poor po intake  -Nutritionist consulted for further evaluation recommendations Continue with Ensure alive p.o. twice daily, Magic cup twice daily with meals, Juven fruit punch twice daily and will check magnesium and phosphorus level in a.m.  DVT prophylaxis: Enoxaparin 40 mg sq q24h Code Status: FULL CODE  Family Communication: No family present at bedside    Disposition Plan: Unsafe discharge disposition given his living conditions and have asked Psych for a Capacity Evaluation as he has refused SNF and that is what is recommended for him by PT/OT  Status is: Inpatient  Remains inpatient appropriate because:Unsafe d/c plan and Inpatient level of care appropriate due to severity of illness  Dispo:  Patient From: Home  Planned Disposition: To be determined; Unsafe Discharge Disposition  Expected discharge date: 11/22/19  Medically stable for discharge: YES  Consultants:   Psychiatry, reconsulted    Procedures:  None  Antimicrobials:  Anti-infectives (From admission, onward)   None     Subjective: Seen and examined at bedside and he denies any complaints.  States he slept fair.  No nausea or vomiting.  No other concerns or complaints at this time.  Objective: Vitals:   11/20/19 2200 11/21/19 0150 11/21/19 0538 11/21/19 1357  BP: 108/81 96/61 121/76 99/77  Pulse: (!) 112 (!) 103 (!) 104 100  Resp: 15 17 15 18   Temp: 99.7 F (37.6 C) 98.4 F (36.9 C) 98.1 F (36.7 C) 99.5 F (37.5 C)  TempSrc: Oral Oral Oral Oral  SpO2: 98% 95% 96% 97%  Weight:  Height:        Intake/Output Summary (Last 24 hours) at 11/21/2019 1747 Last data filed at 11/21/2019 1500 Gross per 24 hour  Intake 1080 ml  Output 1650 ml  Net -570 ml   Filed Weights   11/11/19 1957  Weight: 54.1 kg   Examination: Physical Exam:  Constitutional: Patient is a thin disheveled Caucasian male currently no acute distress appears calm.,  Eyes: Lids and conjunctivae normal, sclerae anicteric  ENMT: External Ears, Nose appear normal. Grossly normal hearing.   Neck: Appears normal, supple, no cervical masses, normal ROM, no appreciable thyromegaly; no JVD Respiratory: Diminished to auscultation bilaterally, no wheezing, rales, rhonchi or crackles. Normal respiratory effort and patient is not tachypenic. No accessory muscle use.  Unlabored  breathing Cardiovascular: RRR, no murmurs / rubs / gallops. S1 and S2 auscultated. No extremity edema. Abdomen: Soft, non-tender, non-distended. Bowel sounds positive.  GU: Deferred. Musculoskeletal: No clubbing / cyanosis of digits/nails. No joint deformity upper and lower extremities. Skin: No rashes, lesions, ulcers on limited skin evaluation but he does have a hip ulceration. No induration; Warm and dry.  Neurologic: CN 2-12 grossly intact with no focal deficits. Romberg sign cerebellar reflexes not assessed.  Psychiatric: Slightly impaired judgment and insight. Alert and awake. Normal mood and appropriate affect.   Data Reviewed: I have personally reviewed following labs and imaging studies  CBC: Recent Labs  Lab 11/16/19 0544 11/17/19 0642 11/18/19 0619 11/20/19 0557 11/21/19 0543  WBC 5.0 5.0 5.8 6.5 6.6  NEUTROABS  --   --   --   --  3.7  HGB 9.2* 10.8* 12.9* 8.4* 8.5*  HCT 27.6* 33.3* 39.8 26.0* 26.1*  MCV 100.0 100.6* 100.0 101.6* 100.4*  PLT 220 260 208 245 253   Basic Metabolic Panel: Recent Labs  Lab 11/16/19 0544 11/17/19 0642 11/18/19 0619 11/20/19 0557 11/21/19 0543  NA 139 138 135 136 135  K 3.8 4.5 4.7 3.9 4.0  CL 105 101 99 103 102  CO2 27 28 27 26 25   GLUCOSE 97 102* 96 96 90  BUN 7 8 15 18 16   CREATININE 0.65 0.56* 0.66 0.59* 0.65  CALCIUM 9.0 9.7 9.2 9.2 9.4  MG 1.7 1.6* 1.8 1.7 1.9  PHOS  --   --   --   --  3.8   GFR: Estimated Creatinine Clearance: 80.8 mL/min (by C-G formula based on SCr of 0.65 mg/dL). Liver Function Tests: Recent Labs  Lab 11/20/19 0557 11/21/19 0543  AST 30 36  ALT 27 32  ALKPHOS 44 47  BILITOT 0.3 0.4  PROT 7.4 7.9  ALBUMIN 2.6* 2.8*   No results for input(s): LIPASE, AMYLASE in the last 168 hours. No results for input(s): AMMONIA in the last 168 hours. Coagulation Profile: No results for input(s): INR, PROTIME in the last 168 hours. Cardiac Enzymes: No results for input(s): CKTOTAL, CKMB, CKMBINDEX,  TROPONINI in the last 168 hours. BNP (last 3 results) No results for input(s): PROBNP in the last 8760 hours. HbA1C: No results for input(s): HGBA1C in the last 72 hours. CBG: No results for input(s): GLUCAP in the last 168 hours. Lipid Profile: No results for input(s): CHOL, HDL, LDLCALC, TRIG, CHOLHDL, LDLDIRECT in the last 72 hours. Thyroid Function Tests: No results for input(s): TSH, T4TOTAL, FREET4, T3FREE, THYROIDAB in the last 72 hours. Anemia Panel: No results for input(s): VITAMINB12, FOLATE, FERRITIN, TIBC, IRON, RETICCTPCT in the last 72 hours. Sepsis Labs: No results for input(s): PROCALCITON, LATICACIDVEN in the last  168 hours.  No results found for this or any previous visit (from the past 240 hour(s)).  RN Pressure Injury Documentation: Pressure Injury 11/11/19 Hip Right Unstageable - Full thickness tissue loss in which the base of the injury is covered by slough (yellow, tan, gray, green or brown) and/or eschar (tan, brown or black) in the wound bed. (Active)  11/11/19 1815  Location: Hip  Location Orientation: Right  Staging: Unstageable - Full thickness tissue loss in which the base of the injury is covered by slough (yellow, tan, gray, green or brown) and/or eschar (tan, brown or black) in the wound bed.  Wound Description (Comments):   Present on Admission: Yes     Pressure Injury 11/11/19 Other (Comment) Left;Lateral Stage 2 -  Partial thickness loss of dermis presenting as a shallow open injury with a red, pink wound bed without slough. (Active)  11/11/19 1815  Location: Other (Comment)  Location Orientation: Left;Lateral  Staging: Stage 2 -  Partial thickness loss of dermis presenting as a shallow open injury with a red, pink wound bed without slough.  Wound Description (Comments):   Present on Admission: Yes   Estimated body mass index is 16.63 kg/m as calculated from the following:   Height as of this encounter: 5\' 11"  (1.803 m).   Weight as of this  encounter: 54.1 kg.  Malnutrition Type:  Nutrition Problem: Moderate Malnutrition Etiology: social / environmental circumstances (ETOH abuse)   Malnutrition Characteristics:  Signs/Symptoms: percent weight loss, moderate fat depletion, moderate muscle depletion Percent weight loss: 22 % (x 9 months)   Nutrition Interventions:  Interventions: Ensure Enlive (each supplement provides 350kcal and 20 grams of protein), Magic cup   Radiology Studies: No results found.  Scheduled Meds:  enoxaparin (LOVENOX) injection  40 mg Subcutaneous Q24H   feeding supplement (ENSURE ENLIVE)  237 mL Oral BID BM   folic acid  1 mg Oral Daily   magnesium oxide  400 mg Oral BID   multivitamin with minerals  1 tablet Oral Daily   nicotine  14 mg Transdermal Daily   nutrition supplement (JUVEN)  1 packet Oral BID BM   QUEtiapine  50 mg Oral BID   thiamine  100 mg Oral Daily   vitamin B-12  1,000 mcg Oral Daily   Continuous Infusions:   LOS: 9 days   , DO [facility] PAGER is on AMION  If 7PM-7AM, please contact night-coverage [email or web address]

## 2019-11-22 NOTE — Progress Notes (Signed)
Occupational Therapy Treatment Patient Details Name: Randall Garcia MRN: [id number] DOB: July 18, 1965 Today's Date: 11/22/2019    History of present illness 54 y.o. male with no known past medical history (never sees doctors) who was brought to the ED via EMS after church members found patient minimally responsive, on his couch in his own urine and feces where he had apparently not moved from for at least 2 weeks. Diagnosis: acute met encephalopahty, rhabdomyolysis.   OT comments  Pt. Was seen for skilled OT to maximize I and safety with ADLs and mobility. Pt. Was able to perform ADLs in sitting with good balance. Pt. Was Min A with amb in his room with walker. Pt. Was able to perform tasks with min cues. Acute OT to continue with patient.   Follow Up Recommendations  SNF;Supervision/Assistance - 24 hour    Equipment Recommendations       Recommendations for Other Services      Precautions / Restrictions Precautions Precautions: Fall Precaution Comments: sacral and right hip wound per RN notes Restrictions Weight Bearing Restrictions: No       Mobility Bed Mobility Overal bed mobility: Needs Assistance Bed Mobility: Supine to Sit     Supine to sit: Supervision        Transfers     Transfers: Sit to/from Stand Sit to Stand: Min assist         General transfer comment: Pt. is Min A with mobility with walker.     Balance     Sitting balance-Leahy Scale: Good                                     ADL either performed or assessed with clinical judgement   ADL Overall ADL's : Needs assistance/impaired     Grooming: Wash/dry hands;Wash/dry face;Brushing hair;Min guard   Upper Body Bathing: Minimal assistance;Sitting   Lower Body Bathing: Minimal assistance;Sit to/from stand   Upper Body Dressing : Minimal assistance;Sitting   Lower Body Dressing: Sit to/from stand;Moderate assistance   Toilet Transfer: Minimal assistance;Comfort height  toilet;Grab bars;RW   Toileting- Clothing Manipulation and Hygiene: Minimal assistance;Sit to/from stand       Functional mobility during ADLs: Minimal assistance;Rolling walker General ADL Comments: Pt. required encouragement to participate with ADLs     Vision       Perception     Praxis      Cognition Arousal/Alertness: Awake/alert Behavior During Therapy: WFL for tasks assessed/performed Overall Cognitive Status: No family/caregiver present to determine baseline cognitive functioning Area of Impairment: Safety/judgement                 Orientation Level: Time;Situation Current Attention Level: Focused Memory: Decreased short-term memory Following Commands: Follows one step commands consistently Safety/Judgement: Decreased awareness of safety;Decreased awareness of deficits Awareness: Intellectual Problem Solving: Slow processing;Decreased initiation;Difficulty sequencing;Requires verbal cues;Requires tactile cues          Exercises     Shoulder Instructions       General Comments      Pertinent Vitals/ Pain       Pain Assessment: 0-10 Pain Score: 2  Pain Location: "my whole left side" Pain Intervention(s): Limited activity within patient's tolerance  Home Living  Prior Functioning/Environment              Frequency  Min 2X/week        Progress Toward Goals  OT Goals(current goals can now be found in the care plan section)  Progress towards OT goals: Progressing toward goals  Acute Rehab OT Goals Patient Stated Goal: to get stronger Time For Goal Achievement: 12/06/19 Potential to Achieve Goals: Fair ADL Goals Pt Will Perform Upper Body Bathing: sitting;with modified independence Pt Will Perform Lower Body Bathing: sit to/from stand;with modified independence Pt Will Perform Upper Body Dressing: sitting;with modified independence Pt Will Perform Lower Body Dressing: sit  to/from stand;sitting/lateral leans;with modified independence Pt Will Transfer to Toilet: regular height toilet;ambulating;with modified independence Pt Will Perform Toileting - Clothing Manipulation and hygiene: sit to/from stand;with modified independence Pt Will Perform Tub/Shower Transfer: grab bars;shower seat;with modified independence Pt/caregiver will Perform Home Exercise Program: Both right and left upper extremity;Increased strength  Plan Discharge plan remains appropriate    Co-evaluation                 AM-PAC OT "6 Clicks" Daily Activity     Outcome Measure   Help from another person eating meals?: None Help from another person taking care of personal grooming?: A Little Help from another person toileting, which includes using toliet, bedpan, or urinal?: A Little Help from another person bathing (including washing, rinsing, drying)?: A Little Help from another person to put on and taking off regular upper body clothing?: A Little Help from another person to put on and taking off regular lower body clothing?: A Lot 6 Click Score: 18    End of Session Equipment Utilized During Treatment: Gait belt;Rolling walker  OT Visit Diagnosis: Unsteadiness on feet (R26.81);Muscle weakness (generalized) (M62.81);Other symptoms and signs involving cognitive function   Activity Tolerance Patient tolerated treatment well   Patient Left in chair;with call bell/phone within reach;with chair alarm set   Nurse Communication  (nurse ok therapy)        Time: 7425-9563 OT Time Calculation (min): 67 min  Charges: OT General Charges $OT Visit: 1 Visit OT Treatments $Self Care/Home Management : 53-67 mins  Reece Packer OT/L   Briggett Tuccillo 11/22/2019, 12:00 PM

## 2019-11-22 NOTE — Progress Notes (Signed)
Randall NOTE    CLABORN JANUSZ  KWI:[id number] DOB: 1966-04-03 DOA: 11/11/2019 PCP: Garcia, Randall Garcia   Brief Narrative: The Garcia is a 54 year old Caucasian disheveled male with Randall known past medical history as he is not see prior physicians who was brought to the ED via EMS after church members found him minimally responsive on his couch in his own urine and feces.  He had not moved from his couch for apparently 2 weeks and nonverbal at the time of his initial presentation.  He and his brother live in the same place and his brother had a similar situation.  Apparently drinks a lot of alcohol and most of the history was provided by the Garcia's pastor and who presented with acute metabolic encephalopathy which is now improved.  Psychiatry was consulted as well.  Wernicke's and alcohol withdrawal delirium and he is improving but still remains intermittently confused.  PT OT recommending SNF however Garcia is uninsured and currently has Randall safe discharge disposition so will remain in the hospital at this time.  Also the Garcia is refusing SNF and have consulted psychiatry for capacity evaluation.  Assessment & Plan:   Principal Problem:   Acute metabolic encephalopathy Active Problems:   Alcohol abuse   Rhabdomyolysis   Hypomagnesemia   Catatonia   Dehydration   Pressure injury of skin   Malnutrition of moderate degree  Acute metabolic encephalopathy/catatonia/Wernicke's/alcohol withdrawal delirium -Garcia did not have any focal deficits.   -CT head was negative for acute findings.  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 -Garcia was nonverbal in the emergency room. He did start speaking but noted to be very confused.  All of this appears to be secondary to Wernicke's.  Apparently drinks alcohol on a daily basis.  He likely also has nutritional deficiencies on top of that.  He was noted to have nystagmus -Seen by psychiatry and did not feel that he needed inpatient psychiatric care. -Garcia  was started on high-dose thiamine.  He was also started on B12 supplementation.   -Folic acid level was 6.1.  TSH was normal.  RPR nonreactive.  HIV nonreactive.  Ammonia level less than 9. -Garcia's mentation has improved but he still remains quite distracted. His alcohol withdrawal delirium seems to be better.    He likely has underlying cognitive deficits not only due to nutritional issues but also due to possible vascular issues based on findings on CT scan.  He still remains disoriented.   -Noted to be on Quetiapine and his dose has now increased to 50 mg po BID -PT/OT recommending SNF; TOC assisting with placement but has Randall safe options for D/C currently given that he is unisured -Garcia refusing SNF and wanting to go home but has been intermittently confused  -Will get Psychiatry to evaluate for Capacity Evaluation and this is still pending to be done  Alcohol withdrawal syndrome in the setting of alcohol abuse -Alcohol withdrawal symptoms appears to have improved.  -Delirium is improving but he appears to have cognitive deficits which could be related to Wernicke's and B12 deficiency.    Mild Rhabdomyolysis -CK was noted to be 557.  Resolved with IV fluids.    Hypokalemia and Hypomagnesemia -Potassium has been normal the last 4 days and today was 4.0   -Also on magnesium oxide -Given Mag Sulfate IV 2 grams yesterday and now improved to 1.9 -Continue to monitor and trend magnesium level as well as CMP -Repeat CMP and magnesium level intermittently   Bacteremia, likely contaminant -Bacillus  species noted in 1 set.  He is afebrile.  His WBC is normal and he is afrebile.   -Likely a contaminant.  Continue to monitor for now.  Randall antibiotics at this time.  Normocytic/Macrocytic Anemia -Drop in hemoglobin is noted and had been improving but dropped again.  Likely dilutional but hemoglobin/hematocrit went from 12.9/39.8 and now dropped to 8.4/26.0 and yesterday is 8.5/26.1 -Anemia  panel done on 11/14/2019 and showed an iron level of 18, U IBC of 103, TIBC of 121, saturation ratios of 15%, ferritin level of 1368, folate level 6.1 -Continue to monitor for signs and symptoms bleeding; currently Randall overt bleeding noted -Repeat CBC in the AM -Continues to worsen will obtain FOBT  Vitamin B12 Deficiency -B12 level noted to be low normal at 264.  Started on supplementation.  Dehydration -Received IV fluids.   -Oral intake seems to have improved.  IV fluids discontinued.  Tobacco Abuse -Smoking Cessation counseling given -C/w Nicotine patch  Pressure injury, poA Pressure Injury 11/11/19 Hip Right Unstageable - Full thickness tissue loss in which the base of the injury is covered by slough (yellow, tan, gray, green or brown) and/or eschar (tan, brown or black) in the wound bed. (Active)  11/11/19 1815  Location: Hip  Location Orientation: Right  Staging: Unstageable - Full thickness tissue loss in which the base of the injury is covered by slough (yellow, tan, gray, green or brown) and/or eschar (tan, brown or black) in the wound bed.  Wound Description (Comments):   Present on Admission: Yes     Pressure Injury 11/11/19 Other (Comment) Left;Lateral Stage 2 -  Partial thickness loss of dermis presenting as a shallow open injury with a red, pink wound bed without slough. (Active)  11/11/19 1815  Location: Other (Comment)  Location Orientation: Left;Lateral  Staging: Stage 2 -  Partial thickness loss of dermis presenting as a shallow open injury with a red, pink wound bed without slough.  Wound Description (Comments):   Present on Admission: Yes   Nonsevere/Moderate malnutrition and Underweight  -In the setting of Chronic Alcoholism and poor po intake  -Nutritionist consulted for further evaluation recommendations Continue with Ensure alive p.o. twice daily, Magic cup twice daily with meals, Juven fruit punch twice daily and will check magnesium and phosphorus level in  a.m.  DVT prophylaxis: Enoxaparin 40 mg sq q24h Code Status: FULL CODE  Family Communication: Randall family present at bedside  Disposition Plan: Unsafe discharge disposition given his living conditions and have asked Psych for a Capacity Evaluation as he has refused SNF and that is what is recommended for him by PT/OT; He is also uninsured so that is another barrier to D/C if he does not have the capacity to refuse   Status is: Inpatient  Remains inpatient appropriate because:Unsafe d/c plan and Inpatient level of care appropriate due to severity of illness  Dispo:  Garcia From: Home  Planned Disposition: To be determined; Unsafe Discharge Disposition  Expected discharge date: 11/22/19  Medically stable for discharge: YES  Consultants:   Psychiatry, reconsulted    Procedures:  None  Antimicrobials:  Anti-infectives (From admission, onward)   None     Subjective: Seen and examined at bedside and he was withdrawn and slightly somnolent and complaining about some knee pain.  Randall nausea or vomiting.  Wanting to rest.  Randall other concerns or complaints this time.  Objective: Vitals:   11/21/19 0538 11/21/19 1357 11/21/19 2138 11/22/19 0616  BP: 121/76 99/77 113/75 (!) 111/91  Pulse: (!) 104 100 (!) 102 96  Resp: 15 18 18 16   Temp: 98.1 F (36.7 C) 99.5 F (37.5 C) 99.3 F (37.4 C) 98.1 F (36.7 C)  TempSrc: Oral Oral Oral Oral  SpO2: 96% 97% 96% 97%  Weight:      Height:        Intake/Output Summary (Last 24 hours) at 11/22/2019 1318 Last data filed at 11/22/2019 01/23/2020 Gross Garcia 24 hour  Intake 960 ml  Output 500 ml  Net 460 ml   Filed Weights   11/11/19 1957  Weight: 54.1 kg   Examination: Physical Exam:  Constitutional: The Garcia is a thin disheveled Caucasian male in  NAD and appears calm and resting and is a little somnolent Respiratory: Diminished to auscultation bilaterally, Randall wheezing, rales, rhonchi or crackles. Normal respiratory effort and Garcia is not  tachypenic. Randall accessory muscle use. Unlabored breathing Cardiovascular: Slightly tachycardic but regular rhythm, Randall murmurs / rubs / gallops. S1 and S2 auscultated. Randall extremity edema. 2+ pedal pulses. Randall carotid bruits.  Abdomen: Soft, non-tender, non-distended. Bowel sounds positive x4.  Neurologic: CN 2-12 grossly intact with Randall focal deficits. Sensation intact in all 4 Extremities, DTR normal. Strength 5/5 in all 4. Romberg sign cerebellar reflexes not assessed.   Data Reviewed: I have personally reviewed following labs and imaging studies  CBC: Recent Labs  Lab 11/16/19 0544 11/17/19 0642 11/18/19 0619 11/20/19 0557 11/21/19 0543  WBC 5.0 5.0 5.8 6.5 6.6  NEUTROABS  --   --   --   --  3.7  HGB 9.2* 10.8* 12.9* 8.4* 8.5*  HCT 27.6* 33.3* 39.8 26.0* 26.1*  MCV 100.0 100.6* 100.0 101.6* 100.4*  PLT 220 260 208 245 253   Basic Metabolic Panel: Recent Labs  Lab 11/16/19 0544 11/17/19 0642 11/18/19 0619 11/20/19 0557 11/21/19 0543  NA 139 138 135 136 135  K 3.8 4.5 4.7 3.9 4.0  CL 105 101 99 103 102  CO2 27 28 27 26 25   GLUCOSE 97 102* 96 96 90  BUN 7 8 15 18 16   CREATININE 0.65 0.56* 0.66 0.59* 0.65  CALCIUM 9.0 9.7 9.2 9.2 9.4  MG 1.7 1.6* 1.8 1.7 1.9  PHOS  --   --   --   --  3.8   GFR: Estimated Creatinine Clearance: 80.8 mL/min (by C-G formula based on SCr of 0.65 mg/dL). Liver Function Tests: Recent Labs  Lab 11/20/19 0557 11/21/19 0543  AST 30 36  ALT 27 32  ALKPHOS 44 47  BILITOT 0.3 0.4  PROT 7.4 7.9  ALBUMIN 2.6* 2.8*   Randall results for input(s): LIPASE, AMYLASE in the last 168 hours. Randall results for input(s): AMMONIA in the last 168 hours. Coagulation Profile: Randall results for input(s): INR, PROTIME in the last 168 hours. Cardiac Enzymes: Randall results for input(s): CKTOTAL, CKMB, CKMBINDEX, TROPONINI in the last 168 hours. BNP (last 3 results) Randall results for input(s): PROBNP in the last 8760 hours. HbA1C: Randall results for input(s): HGBA1C in the last  72 hours. CBG: Randall results for input(s): GLUCAP in the last 168 hours. Lipid Profile: Randall results for input(s): CHOL, HDL, LDLCALC, TRIG, CHOLHDL, LDLDIRECT in the last 72 hours. Thyroid Function Tests: Randall results for input(s): TSH, T4TOTAL, FREET4, T3FREE, THYROIDAB in the last 72 hours. Anemia Panel: Randall results for input(s): VITAMINB12, FOLATE, FERRITIN, TIBC, IRON, RETICCTPCT in the last 72 hours. Sepsis Labs: Randall results for input(s): PROCALCITON, LATICACIDVEN in the last 168 hours.  Randall results  found for this or any previous visit (from the past 240 hour(s)).  RN Pressure Injury Documentation: Pressure Injury 11/11/19 Hip Right Unstageable - Full thickness tissue loss in which the base of the injury is covered by slough (yellow, tan, gray, green or brown) and/or eschar (tan, brown or black) in the wound bed. (Active)  11/11/19 1815  Location: Hip  Location Orientation: Right  Staging: Unstageable - Full thickness tissue loss in which the base of the injury is covered by slough (yellow, tan, gray, green or brown) and/or eschar (tan, brown or black) in the wound bed.  Wound Description (Comments):   Present on Admission: Yes     Pressure Injury 11/11/19 Other (Comment) Left;Lateral Stage 2 -  Partial thickness loss of dermis presenting as a shallow open injury with a red, pink wound bed without slough. (Active)  11/11/19 1815  Location: Other (Comment)  Location Orientation: Left;Lateral  Staging: Stage 2 -  Partial thickness loss of dermis presenting as a shallow open injury with a red, pink wound bed without slough.  Wound Description (Comments):   Present on Admission: Yes   Estimated body mass index is 16.63 kg/m as calculated from the following:   Height as of this encounter: 5\' 11"  (1.803 m).   Weight as of this encounter: 54.1 kg.  Malnutrition Type:  Nutrition Problem: Moderate Malnutrition Etiology: social / environmental circumstances (ETOH abuse)   Malnutrition  Characteristics:  Signs/Symptoms: percent weight loss, moderate fat depletion, moderate muscle depletion Percent weight loss: 22 % (x 9 months)   Nutrition Interventions:  Interventions: Ensure Enlive (each supplement provides 350kcal and 20 grams of protein), Magic cup   Radiology Studies: Randall results found.  Scheduled Meds: . enoxaparin (LOVENOX) injection  40 mg Subcutaneous Q24H  . feeding supplement (ENSURE ENLIVE)  237 mL Oral BID BM  . folic acid  1 mg Oral Daily  . magnesium oxide  400 mg Oral BID  . multivitamin with minerals  1 tablet Oral Daily  . nicotine  14 mg Transdermal Daily  . nutrition supplement (JUVEN)  1 packet Oral BID BM  . QUEtiapine  50 mg Oral BID  . thiamine  100 mg Oral Daily  . vitamin B-12  1,000 mcg Oral Daily   Continuous Infusions:   LOS: 10 days   Merlene Laughtermair Latif Huber Mathers, DO [facility] PAGER is on AMION  If 7PM-7AM, please contact night-coverage [email or web address]

## 2019-11-22 NOTE — Consult Note (Signed)
[facility] Face-to-Face Psychiatry Consult   Reason for Consult:  Catatonic State Referring Physician:  Osvaldo Shipper  Patient Identification: Randall Garcia MRN:  [id number] Principal Diagnosis: Acute metabolic encephalopathy Diagnosis:  Principal Problem:   Acute metabolic encephalopathy Active Problems:   Alcohol abuse   Rhabdomyolysis   Hypomagnesemia   Catatonia   Dehydration   Pressure injury of skin   Malnutrition of moderate degree   Total Time spent with patient: 30 minutes  Subjective:   Randall Garcia is a 54 y.o. male patient admitted after church members found him minimally responsive on his couch sitting in his own feces and urine. Patient stay has been complicated by extended length of stay due to acute rehabilitaton, pressure wounds, and rhabdomyolysis. Patient is assessed and evaluated by this nurse practitioner, Randall Garcia does not forward much information at this time. Randall Garcia does appear to have improved general appearance since his admission and original reassessment 12 days. Randall Garcia denies any depressive symptoms at this time. Randall Garcia denies any pas psychiatric history other than alcohol use. Randall Garcia denies suicidal ideation, homicidal ideations, and or hallucinations.   Per chart review, patient was admitted on 11/11/19 after church members found him sitting in his own urine and urine. Randall Garcia had not moved from the couch in several days. Randall Garcia has improved since admission although Randall Garcia still needs long term rehab and occupational therapy. Randall Garcia has been declining admission to SNF facility and it was concern for decompensation if Randall Garcia returns home.    Of note, patient was admitted to the hospital in 01/2019, with sepsis and had another admission to the medical floor for alcohol intoxication and encephalopathy. Randall Garcia has had three medical admissions since last year all of which has been complicated by extended length of stay.      On interview, Mr. Peifer is unable to report his reasons for admissions. Randall Garcia is alert  and oriented to self x 2. Randall Garcia answers questions appropriately, however is very vague with his answers. Randall Garcia is unable to forward much information as to why Randall Garcia is in the hospital , barriers Randall Garcia faces to discharge, and risks to going home vs nursing facility.   Randall Garcia is unaware of the concern for possible infections, encephalopathy, malnutrition, and decompensation. Randall Garcia is unable to appreciate the seriousness of his current conditions and returning home, including the risk of death without receiving proper treatment and rehab. + Randall Garcia reports that Randall Garcia has been in the hospital for 2 weeks and continues to endorse problems with his brother at home. Randall Garcia denies SI, HI or AVH. Randall Garcia denies a history of suicide attempts. Randall Garcia denies a history of mood symptoms.      HPI:  54 y.o.malewithno known pastmedical history(never sees doctors) who wasbrought to the ED via EMS after church members found patient minimally responsive, on his couch inhis ownurine and feces where Randall Garcia had apparently not moved fromforat least 2 weeks.  Patient was nonverbal at the time of initial presentation.  Apparently Randall Garcia lives with his brother who also is in a similar situation.  Apparently drinks a lot of alcohol.  Most of the history was provided by patient's pastor.     Past Psychiatric History: History of alcohol abuse. Patient denies any past psych history. Randall Garcia denies any previous outpatient services, inpatient history, or psychiatric admissions. Randall Garcia denies any substance abuse. Randall Garcia denies any suicide attempts.   Risk to Self:  Denies Risk to Others:  Denies Prior Inpatient Therapy:   Denies Prior Outpatient Therapy:  Denies  Past Medical History:  Past Medical History:  Diagnosis Date  . Medical history non-contributory   . Seasonal allergies    History reviewed. No pertinent surgical history. Family History: History reviewed. No pertinent family history. Family Psychiatric  History: Denies Social History:  Social History    Substance and Sexual Activity  Alcohol Use Yes     Social History   Substance and Sexual Activity  Drug Use Yes  . Types: Marijuana    Social History   Socioeconomic History  . Marital status: Single    Spouse name: Not on file  . Number of children: Not on file  . Years of education: Not on file  . Highest education level: Not on file  Occupational History  . Not on file  Tobacco Use  . Smoking status: Current Every Day Smoker  . Smokeless tobacco: Never Used  Substance and Sexual Activity  . Alcohol use: Yes  . Drug use: Yes    Types: Marijuana  . Sexual activity: Not on file  Other Topics Concern  . Not on file  Social History Narrative  . Not on file   Social Determinants of Health   Financial Resource Strain:   . Difficulty of Paying Living Expenses:   Food Insecurity:   . Worried About Programme researcher, broadcasting/film/video in the Last Year:   . Barista in the Last Year:   Transportation Needs:   . Freight forwarder (Medical):   [name] Kitchen Lack of Transportation (Non-Medical):   Physical Activity:   . Days of Exercise per Week:   . Minutes of Exercise per Session:   Stress:   . Feeling of Stress :   Social Connections:   . Frequency of Communication with Friends and Family:   . Frequency of Social Gatherings with Friends and Family:   . Attends Religious Services:   . Active Member of Clubs or Organizations:   . Attends Banker Meetings:   [name] Kitchen Marital Status:    Additional Social History:    Allergies:  No Known Allergies  Labs:  Results for orders placed or performed during the hospital encounter of 11/11/19 (from the past 48 hour(s))  CBC with Differential/Platelet     Status: Abnormal   Collection Time: 11/21/19  5:43 AM  Result Value Ref Range   WBC 6.6 4.0 - 10.5 K/uL   RBC 2.60 (L) 4.22 - 5.81 MIL/uL   Hemoglobin 8.5 (L) 13.0 - 17.0 g/dL   HCT 16.0 (L) 39 - 52 %   MCV 100.4 (H) 80.0 - 100.0 fL   MCH 32.7 26.0 - 34.0 pg   MCHC 32.6  30.0 - 36.0 g/dL   RDW 73.7 10.6 - 26.9 %   Platelets 253 150 - 400 K/uL   nRBC 0.0 0.0 - 0.2 %   Neutrophils Relative % 56 %   Neutro Abs 3.7 1.7 - 7.7 K/uL   Lymphocytes Relative 27 %   Lymphs Abs 1.8 0.7 - 4.0 K/uL   Monocytes Relative 14 %   Monocytes Absolute 0.9 0 - 1 K/uL   Eosinophils Relative 2 %   Eosinophils Absolute 0.1 0 - 0 K/uL   Basophils Relative 0 %   Basophils Absolute 0.0 0 - 0 K/uL   Immature Granulocytes 1 %   Abs Immature Granulocytes 0.06 0.00 - 0.07 K/uL    Comment: Performed at [facility], [address] [place], [place] [address]  Comprehensive metabolic panel  Status: Abnormal   Collection Time: 11/21/19  5:43 AM  Result Value Ref Range   Sodium 135 135 - 145 mmol/L   Potassium 4.0 3.5 - 5.1 mmol/L   Chloride 102 98 - 111 mmol/L   CO2 25 22 - 32 mmol/L   Glucose, Bld 90 70 - 99 mg/dL    Comment: Glucose reference range applies only to samples taken after fasting for at least 8 hours.   BUN 16 6 - 20 mg/dL   Creatinine, Ser 7.62 0.61 - 1.24 mg/dL   Calcium 9.4 8.9 - 26.3 mg/dL   Total Protein 7.9 6.5 - 8.1 g/dL   Albumin 2.8 (L) 3.5 - 5.0 g/dL   AST 36 15 - 41 U/L   ALT 32 0 - 44 U/L   Alkaline Phosphatase 47 38 - 126 U/L   Total Bilirubin 0.4 0.3 - 1.2 mg/dL   GFR calc non Af Amer >60 >60 mL/min   GFR calc Af Amer >60 >60 mL/min   Anion gap 8 5 - 15    Comment: Performed at [facility] [address] [place], [place] [address]  Phosphorus     Status: None   Collection Time: 11/21/19  5:43 AM  Result Value Ref Range   Phosphorus 3.8 2.5 - 4.6 mg/dL    Comment: Performed at [facility], [address] [place], [place] [address]  Magnesium     Status: None   Collection Time: 11/21/19  5:43 AM  Result Value Ref Range   Magnesium 1.9 1.7 - 2.4 mg/dL    Comment: Performed at [facility], [address], [place], [place] [address]    Current  Facility-Administered Medications  Medication Dose Route Frequency Provider Last Rate Last Admin  . acetaminophen (TYLENOL) tablet 650 mg  650 mg Oral Q6H PRN Esaw Grandchild A, DO       Or  . acetaminophen (TYLENOL) suppository 650 mg  650 mg Rectal Q6H PRN Esaw Grandchild A, DO      . bisacodyl (DULCOLAX) EC tablet 5 mg  5 mg Oral Daily PRN Esaw Grandchild A, DO      . enoxaparin (LOVENOX) injection 40 mg  40 mg Subcutaneous Q24H Esaw Grandchild A, DO   40 mg at 11/19/19 2240  . feeding supplement (ENSURE ENLIVE) (ENSURE ENLIVE) liquid 237 mL  237 mL Oral BID BM Esaw Grandchild A, DO   237 mL at 11/22/19 1311  . folic acid (FOLVITE) tablet 1 mg  1 mg Oral Daily Len Childs T, RPH   1 mg at 11/22/19 1309  . hydrALAZINE (APRESOLINE) tablet 25 mg  25 mg Oral Q6H PRN Esaw Grandchild A, DO      . magnesium oxide (MAG-OX) tablet 400 mg  400 mg Oral BID Osvaldo Shipper, MD   400 mg at 11/22/19 1310  . multivitamin with minerals tablet 1 tablet  1 tablet Oral Daily Esaw Grandchild A, DO   1 tablet at 11/22/19 1310  . nicotine (NICODERM CQ - dosed in mg/24 hours) patch 14 mg  14 mg Transdermal Daily Osvaldo Shipper, MD   14 mg at 11/20/19 1030  . nutrition supplement (JUVEN) (JUVEN) powder packet 1 packet  1 packet Oral BID BM Osvaldo Shipper, MD   1 packet at 11/22/19 1310  . ondansetron (ZOFRAN) tablet 4 mg  4 mg Oral Q6H PRN Esaw Grandchild A, DO       Or  . ondansetron (ZOFRAN) injection 4 mg  4 mg Intravenous Q6H PRN Esaw GrandchildGriffith, Kelly A, DO      . QUEtiapine (SEROQUEL) tablet 50 mg  50 mg Oral BID Osvaldo ShipperKrishnan, Gokul, MD   50 mg at 11/22/19 1310  . senna-docusate (Senokot-S) tablet 1 tablet  1 tablet Oral QHS PRN Esaw GrandchildGriffith, Kelly A, DO      . thiamine tablet 100 mg  100 mg Oral Daily Osvaldo ShipperKrishnan, Gokul, MD   100 mg at 11/22/19 1309  . vitamin B-12 (CYANOCOBALAMIN) tablet 1,000 mcg  1,000 mcg Oral Daily Osvaldo ShipperKrishnan, Gokul, MD   1,000 mcg at 11/22/19 1309    Musculoskeletal: Strength & Muscle Tone: within  normal limits Gait & Station: normal Patient leans: N/A  Psychiatric Specialty Exam: Physical Exam   Review of Systems   Blood pressure (!) 111/91, pulse 96, temperature 98.1 F (36.7 C), temperature source Oral, resp. rate 16, height 5\' 11"  (1.803 m), weight 54.1 kg, SpO2 97 %.Body mass index is 16.63 kg/m.  General Appearance: Disheveled  Eye Contact:  Poor  Speech:  Clear and Coherent, Slow and lethargic  Volume:  Decreased  Mood:  Depressed and Dysphoric  Affect:  Congruent, Constricted and Flat  Thought Process:  Linear and Descriptions of Associations: Intact  Orientation:  Full (Time, Place, and Person)  Thought Content:  Logical  Suicidal Thoughts:  No  Homicidal Thoughts:  No  Memory:  Immediate;   Fair Recent;   Poor Remote;   Poor  Judgement:  Poor  Insight:  Shallow  Psychomotor Activity:  Normal  Concentration:  Concentration: Poor and Attention Span: Fair  Recall:  Poor  Fund of Knowledge:  Poor  Language:  Fair  Akathisia:  No  Handed:  Right  AIMS (if indicated):     Assets:  Communication Skills Desire for Improvement Financial Resources/Insurance Housing  ADL's:  Intact  Cognition:  Impaired,  Mild  Sleep:       Mental Capacity Assessment: I have evaluated the following areas to assess the Sherrell E Bostwick's mental capacity regarding medical decision-making ability which pertains to competency to accept or refuse medical treatment.   The specific treatment or service in question is: refusing SNF placement.  Communication: The patient was unable to clearly state preferred treatment options for his medical care at this time. The patient was unable to decide that Randall Garcia does want SNF placement, and this flucuates with his moods and willingness to go home.    Understanding: The patient was unable to recall information, link causal relationships, and process general probabilities regarding life situations and medical treatment scenarios . Randall Garcia was able to  paraphrase her view of the current situation and her thoughts about it including (or "but not including) future-oriented scenarios "I just want to go home but my brother will see to it that I dont.  " . The patient did present with impairments in memory, attention span, or intelligence.   Appreciation: The patient was unable to identify and describe his various illnesses and treatment options with potential outcomes. The patient did not present with concerns such as denial or delusional thought-process. " I dont now why Im here. I dont now how long I been here. I dont need a nursing home but I cant go home. "    Rationalization: The patient is unable to weigh risks and benefits and come to a conclusion congruent with patient's perceived goals. Concerns regarding this category are: depression, acute/chronic encephalopathy, electrolyte imbalances,  In conclusion, the patient is-not experiencing an acute medical scenario: Recent  refusal to SNF placement, which would compromise his mental capacity.  Conclusion: At this time, there is sufficient evidence to warrant removal of the patient's rights for medical decision-making. Randall Garcia can not clearly determine mental capacity for decision-making.  At this time, we can determine that the patient does not have functional mental capacity for medical decision-making including the right to accept or refuse SNF placement. Please contact ethics or next of kin for guardianship and decision making.   Treatment Plan Summary: Plan Patient does not have the mental capacity to make medical decisions for SNF placement. Randall Garcia does not meet inpatient criteria or involuntary commitment at this time. Will recommend starting him on Mirtazapine 7.5mg  po qhs for depression and help to increase his appetite.   Disposition: No evidence of imminent risk to self or others at present.   Patient does not meet criteria for psychiatric inpatient admission. Supportive therapy provided about  ongoing stressors. It is with recommendations that SW be consulted to assist with higher level of care such as SNF/LTAC. Patient is no longer able to care for himself, and has had 3 high medical acuity admissions.   Maryagnes Amos, FNP 11/22/2019 3:57 PM

## 2019-11-22 NOTE — Progress Notes (Signed)
Physical Therapy Treatment Patient Details Name: Randall Garcia MRN: [id number] DOB: November 25, 1965 Today's Date: 11/22/2019    History of Present Illness 54 y.o. male with no known past medical history (never sees doctors) who was brought to the ED via EMS after church members found patient minimally responsive, on his couch in his own urine and feces where he had apparently not moved from for at least 2 weeks. Diagnosis: acute met encephalopahty, rhabdomyolysis.    PT Comments    Pt demonstrating good progress; however, still with poor safety awareness and high fall risk.  Continue to recommend SNF.    Follow Up Recommendations  SNF;Supervision/Assistance - 24 hour     Equipment Recommendations  Rolling walker with 5" wheels    Recommendations for Other Services       Precautions / Restrictions Precautions Precautions: Fall Precaution Comments: sacral and right hip wound per RN notes    Mobility  Bed Mobility Overal bed mobility: Needs Assistance Bed Mobility: Supine to Sit     Supine to sit: Min guard;HOB elevated     General bed mobility comments: min guard for safety; increased time  Transfers Overall transfer level: Needs assistance Equipment used: Rolling walker (2 wheeled) Transfers: Sit to/from Stand Sit to Stand: Min assist;From elevated surface         General transfer comment: cues for safe hand placement  Ambulation/Gait Ambulation/Gait assistance: Min assist Gait Distance (Feet): 80 Feet Assistive device: Rolling walker (2 wheeled) Gait Pattern/deviations: Narrow base of support;Step-through pattern;Decreased stride length Gait velocity: decreased   General Gait Details: no c/o dizziness; c/o L leg pain and demonstrated decreased WBing and ROM on L leg; cues for posture and RW proximity; min A for steadying   Stairs             Wheelchair Mobility    Modified Rankin (Stroke Patients Only)       Balance Overall balance assessment:  Needs assistance Sitting-balance support: No upper extremity supported;Feet supported Sitting balance-Leahy Scale: Good     Standing balance support: Bilateral upper extremity supported;During functional activity Standing balance-Leahy Scale: Poor Standing balance comment: required [place] of RW                            Cognition Arousal/Alertness: Awake/alert Behavior During Therapy: WFL for tasks assessed/performed Overall Cognitive Status: No family/caregiver present to determine baseline cognitive functioning Area of Impairment: Safety/judgement;Orientation                 Orientation Level: Disoriented to;Time;Situation   Memory: Decreased short-term memory Following Commands: Follows one step commands consistently Safety/Judgement: Decreased awareness of safety;Decreased awareness of deficits     General Comments: increased time for mobility and to process commands      Exercises      General Comments General comments (skin integrity, edema, etc.): VSS; frequent multimodal cues      Pertinent Vitals/Pain Pain Assessment: Faces Faces Pain Scale: Hurts little more Pain Location: "my whole left side" Pain Descriptors / Indicators: Discomfort Pain Intervention(s): Limited activity within patient's tolerance;Monitored during session    Home Living                      Prior Function            PT Goals (current goals can now be found in the care plan section) Acute Rehab PT Goals Patient Stated Goal: to get stronger PT Goal Formulation:  With patient Time For Goal Achievement: 11/26/19 Potential to Achieve Goals: Fair Progress towards PT goals: Progressing toward goals    Frequency    Min 2X/week      PT Plan Current plan remains appropriate    Co-evaluation              AM-PAC PT "6 Clicks" Mobility   Outcome Measure  Help needed turning from your back to your side while in a flat bed without using bedrails?: A  Little Help needed moving from lying on your back to sitting on the side of a flat bed without using bedrails?: A Little Help needed moving to and from a bed to a chair (including a wheelchair)?: A Little Help needed standing up from a chair using your arms (e.g., wheelchair or bedside chair)?: A Little Help needed to walk in hospital room?: A Little Help needed climbing 3-5 steps with a railing? : A Lot 6 Click Score: 17    End of Session Equipment Utilized During Treatment: Gait belt Activity Tolerance: Patient tolerated treatment well Patient left: in chair;with chair alarm set;with call bell/phone within reach Nurse Communication: Mobility status PT Visit Diagnosis: Muscle weakness (generalized) (M62.81);Difficulty in walking, not elsewhere classified (R26.2)     Time: 4287-6811 PT Time Calculation (min) (ACUTE ONLY): 20 min  Charges:  $Gait Training: 8-22 mins                     Abran Richard, PT Acute Rehab Services Pager [phone] [facility] [place] 11/22/2019, 5:41 PM

## 2019-11-23 MED ORDER — MIRTAZAPINE 15 MG PO TABS
7.5000 mg | ORAL_TABLET | Freq: Every day | ORAL | Status: DC
  Administered 2019-11-23 – 2019-11-25 (×3): 7.5 mg via ORAL
  Filled 2019-11-23 (×3): qty 1

## 2019-11-23 NOTE — Progress Notes (Signed)
PROGRESS NOTE    Randall Garcia  KYH:[id number] DOB: 1966-01-01 DOA: 11/11/2019 PCP: Patient, No Pcp Per   Brief Narrative: The patient is a 54 year old Caucasian disheveled male with no known past medical history as he is not see prior physicians who was brought to the ED via EMS after church members found him minimally responsive on his couch in his own urine and feces.  He had not moved from his couch for apparently 2 weeks and nonverbal at the time of his initial presentation.  He and his brother live in the same place and his brother had a similar situation.  Apparently drinks a lot of alcohol and most of the history was provided by the patient's pastor and who presented with acute metabolic encephalopathy which is now improved.  Psychiatry was consulted as well.  Wernicke's and alcohol withdrawal delirium and he is improving but still remains intermittently confused.  PT OT recommending SNF however patient is uninsured and currently has no safe discharge disposition so will remain in the hospital at this time.  Also the patient is refusing SNF and have consulted psychiatry for capacity evaluation and they have deemed the patient not having the mental capacity to make decisions for SNF placement. They also recommending starting Mirtazapine 7.5 mg po qHS   Assessment & Plan:   Principal Problem:   Acute metabolic encephalopathy Active Problems:   Alcohol abuse   Rhabdomyolysis   Hypomagnesemia   Catatonia   Dehydration   Pressure injury of skin   Malnutrition of moderate degree  Acute metabolic encephalopathy/catatonia/Wernicke's/alcohol withdrawal delirium -Patient did not have any focal deficits.   -CT head was negative for acute findings.  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 -Patient was nonverbal in the emergency room. He did start speaking but noted to be very confused.  All of this appears to be secondary to Wernicke's.  Apparently drinks alcohol on a daily basis.  He likely also has  nutritional deficiencies on top of that.  He was noted to have nystagmus -Seen by psychiatry and did not feel that he needed inpatient psychiatric care. -Patient was started on high-dose thiamine.  He was also started on B12 supplementation.   -Folic acid level was 6.1.  TSH was normal.  RPR nonreactive.  HIV nonreactive.  Ammonia level less than 9. -Patient's mentation has improved but he still remains quite distracted. His alcohol withdrawal delirium seems to be better.    He likely has underlying cognitive deficits not only due to nutritional issues but also due to possible vascular issues based on findings on CT scan.  He still remains disoriented.   -Noted to be on Quetiapine and his dose has now increased to 50 mg po BID -PT/OT recommending SNF; TOC assisting with placement but has no safe options for D/C currently given that he is unisured and does NOT Have the mental capacity to refuse SNF -Patient refusing SNF and wanting to go home but has been intermittently confused  -Will get Psychiatry to evaluate for Capacity Evaluation and they have deemed him not to have mental capacity to Refuse SNF -Will need TOC assistance for SNF Placement and have next of kin, family or ethics for guardianship and decision making   Alcohol withdrawal syndrome in the setting of alcohol abuse -Alcohol withdrawal symptoms appears to have improved.  -Delirium is improving but he appears to have cognitive deficits which could be related to Wernicke's and B12 deficiency.    Mild Rhabdomyolysis -CK was noted to be 557.  Resolved with IV fluids.    Hypokalemia and Hypomagnesemia -Potassium has been norma and last K+ was 4.0  -Also on magnesium oxide -Last Mag was 1.9 -Continue to monitor and trend magnesium level as well as CMP -Repeat CMP and magnesium level intermittently   Bacteremia, likely contaminant -Bacillus species noted in 1 set.  He is afebrile.  His WBC is normal and he is afrebile.   -Likely a  contaminant.  Continue to monitor for now.  No antibiotics at this time.  Normocytic/Macrocytic Anemia -Drop in hemoglobin is noted and had been improving but dropped again.  Likely dilutional but hemoglobin/hematocrit went from 12.9/39.8 and now dropped to 8.4/26.0 and the day before yesterday is 8.5/26.1 -Anemia panel done on 11/14/2019 and showed an iron level of 18, U IBC of 103, TIBC of 121, saturation ratios of 15%, ferritin level of 1368, folate level 6.1 -Continue to monitor for signs and symptoms bleeding; currently no overt bleeding noted -Repeat CBC in the AM -Continues to worsen will obtain FOBT  Vitamin B12 Deficiency -B12 level noted to be low normal at 264.  Started on supplementation.  Dehydration -Received IV fluids.   -Oral intake seems to have improved.  IV fluids discontinued.  Tobacco Abuse -Smoking Cessation counseling given -C/w Nicotine patch  Pressure injury, poA Pressure Injury 11/11/19 Hip Right Unstageable - Full thickness tissue loss in which the base of the injury is covered by slough (yellow, tan, gray, green or brown) and/or eschar (tan, brown or black) in the wound bed. (Active)  11/11/19 1815  Location: Hip  Location Orientation: Right  Staging: Unstageable - Full thickness tissue loss in which the base of the injury is covered by slough (yellow, tan, gray, green or brown) and/or eschar (tan, brown or black) in the wound bed.  Wound Description (Comments):   Present on Admission: Yes     Pressure Injury 11/11/19 Other (Comment) Left;Lateral Stage 2 -  Partial thickness loss of dermis presenting as a shallow open injury with a red, pink wound bed without slough. (Active)  11/11/19 1815  Location: Other (Comment)  Location Orientation: Left;Lateral  Staging: Stage 2 -  Partial thickness loss of dermis presenting as a shallow open injury with a red, pink wound bed without slough.  Wound Description (Comments):   Present on Admission: Yes    Nonsevere/Moderate malnutrition and Underweight  -In the setting of Chronic Alcoholism and poor po intake  -Nutritionist consulted for further evaluation recommendations Continue with Ensure alive p.o. twice daily, Magic cup twice daily with meals, Juven fruit punch twice daily and will check magnesium and phosphorus level in a.m.  DVT prophylaxis: Enoxaparin 40 mg sq q24h Code Status: FULL CODE  Family Communication: No family present at bedside  Disposition Plan: Unsafe discharge disposition given his living conditions and have asked Psych for a Capacity Evaluation as he has refused SNF and that is what is recommended for him by PT/OT; Pysch has deemed him not to have mental capacity. He is also uninsured so that is another barrier to D/C as he does not have the capacity to refuse SNF placement  Status is: Inpatient  Remains inpatient appropriate because:Unsafe d/c plan and Inpatient level of care appropriate due to severity of illness  Dispo:  Patient From: Home  Planned Disposition: To be determined; Unsafe Discharge Disposition  Expected discharge date: 11/22/19  Medically stable for discharge: YES  Consultants:   Psychiatry, reconsulted    Procedures:  None  Antimicrobials:  Anti-infectives (From admission,  onward)   None     Subjective: Seen and examined at bedside and he was sitting in the chair bedside. No CP, SOB, Nausea or Vomiting. States his knee is a little sore. No other concerns or complaints at this time.   Objective: Vitals:   11/21/19 2138 11/22/19 0616 11/22/19 2120 11/23/19 0526  BP: 113/75 (!) 111/91 111/76 100/70  Pulse: (!) 102 96 97 92  Resp: 18 16 18 18   Temp: 99.3 F (37.4 C) 98.1 F (36.7 C) 98.9 F (37.2 C) 98.1 F (36.7 C)  TempSrc: Oral Oral Oral Oral  SpO2: 96% 97% 96% 96%  Weight:      Height:        Intake/Output Summary (Last 24 hours) at 11/23/2019 1201 Last data filed at 11/23/2019 0931 Gross per 24 hour  Intake 720 ml   Output --  Net 720 ml   Filed Weights   11/11/19 1957  Weight: 54.1 kg   Examination: Physical Exam:  Constitutional: Thin disheveled Caucasian male in NAD and appears calm and comfortable Respiratory: Diminished to auscultation bilaterally, no wheezing, rales, rhonchi or crackles. Normal respiratory effort and patient is not tachypenic. No accessory muscle use.  Cardiovascular: RRR, no murmurs / rubs / gallops. S1 and S2 auscultated. No extremity edema.  Abdomen: Soft, non-tender, non-distended. Bowel sounds positive.  Skin: No rashes, lesions, ulcers on a limited skin evaluation. No induration; Warm and dry.  Neurologic: CN 2-12 grossly intact with no focal deficits. Romberg sign and cerebellar reflexes not assessed.    Data Reviewed: I have personally reviewed following labs and imaging studies  CBC: Recent Labs  Lab 11/17/19 0642 11/18/19 0619 11/20/19 0557 11/21/19 0543  WBC 5.0 5.8 6.5 6.6  NEUTROABS  --   --   --  3.7  HGB 10.8* 12.9* 8.4* 8.5*  HCT 33.3* 39.8 26.0* 26.1*  MCV 100.6* 100.0 101.6* 100.4*  PLT 260 208 245 253   Basic Metabolic Panel: Recent Labs  Lab 11/17/19 0642 11/18/19 0619 11/20/19 0557 11/21/19 0543  NA 138 135 136 135  K 4.5 4.7 3.9 4.0  CL 101 99 103 102  CO2 28 27 26 25   GLUCOSE 102* 96 96 90  BUN 8 15 18 16   CREATININE 0.56* 0.66 0.59* 0.65  CALCIUM 9.7 9.2 9.2 9.4  MG 1.6* 1.8 1.7 1.9  PHOS  --   --   --  3.8   GFR: Estimated Creatinine Clearance: 80.8 mL/min (by C-G formula based on SCr of 0.65 mg/dL). Liver Function Tests: Recent Labs  Lab 11/20/19 0557 11/21/19 0543  AST 30 36  ALT 27 32  ALKPHOS 44 47  BILITOT 0.3 0.4  PROT 7.4 7.9  ALBUMIN 2.6* 2.8*   No results for input(s): LIPASE, AMYLASE in the last 168 hours. No results for input(s): AMMONIA in the last 168 hours. Coagulation Profile: No results for input(s): INR, PROTIME in the last 168 hours. Cardiac Enzymes: No results for input(s): CKTOTAL, CKMB,  CKMBINDEX, TROPONINI in the last 168 hours. BNP (last 3 results) No results for input(s): PROBNP in the last 8760 hours. HbA1C: No results for input(s): HGBA1C in the last 72 hours. CBG: No results for input(s): GLUCAP in the last 168 hours. Lipid Profile: No results for input(s): CHOL, HDL, LDLCALC, TRIG, CHOLHDL, LDLDIRECT in the last 72 hours. Thyroid Function Tests: No results for input(s): TSH, T4TOTAL, FREET4, T3FREE, THYROIDAB in the last 72 hours. Anemia Panel: No results for input(s): VITAMINB12, FOLATE, FERRITIN, TIBC, IRON,  RETICCTPCT in the last 72 hours. Sepsis Labs: No results for input(s): PROCALCITON, LATICACIDVEN in the last 168 hours.  No results found for this or any previous visit (from the past 240 hour(s)).  RN Pressure Injury Documentation: Pressure Injury 11/11/19 Hip Right Unstageable - Full thickness tissue loss in which the base of the injury is covered by slough (yellow, tan, gray, green or brown) and/or eschar (tan, brown or black) in the wound bed. (Active)  11/11/19 1815  Location: Hip  Location Orientation: Right  Staging: Unstageable - Full thickness tissue loss in which the base of the injury is covered by slough (yellow, tan, gray, green or brown) and/or eschar (tan, brown or black) in the wound bed.  Wound Description (Comments):   Present on Admission: Yes     Pressure Injury 11/11/19 Other (Comment) Left;Lateral Stage 2 -  Partial thickness loss of dermis presenting as a shallow open injury with a red, pink wound bed without slough. (Active)  11/11/19 1815  Location: Other (Comment)  Location Orientation: Left;Lateral  Staging: Stage 2 -  Partial thickness loss of dermis presenting as a shallow open injury with a red, pink wound bed without slough.  Wound Description (Comments):   Present on Admission: Yes   Estimated body mass index is 16.63 kg/m as calculated from the following:   Height as of this encounter: 5\' 11"  (1.803 m).   Weight as  of this encounter: 54.1 kg.  Malnutrition Type:  Nutrition Problem: Moderate Malnutrition Etiology: social / environmental circumstances (ETOH abuse)   Malnutrition Characteristics:  Signs/Symptoms: percent weight loss, moderate fat depletion, moderate muscle depletion Percent weight loss: 22 % (x 9 months)   Nutrition Interventions:  Interventions: Ensure Enlive (each supplement provides 350kcal and 20 grams of protein), Magic cup   Radiology Studies: No results found.  Scheduled Meds: . enoxaparin (LOVENOX) injection  40 mg Subcutaneous Q24H  . feeding supplement (ENSURE ENLIVE)  237 mL Oral BID BM  . folic acid  1 mg Oral Daily  . magnesium oxide  400 mg Oral BID  . mirtazapine  7.5 mg Oral QHS  . multivitamin with minerals  1 tablet Oral Daily  . nicotine  14 mg Transdermal Daily  . nutrition supplement (JUVEN)  1 packet Oral BID BM  . QUEtiapine  50 mg Oral BID  . thiamine  100 mg Oral Daily  . vitamin B-12  1,000 mcg Oral Daily   Continuous Infusions:   LOS: 11 days   , DO [facility] PAGER is on AMION  If 7PM-7AM, please contact night-coverage [email or web address]

## 2019-11-24 NOTE — Progress Notes (Signed)
PROGRESS NOTE    Randall Garcia  KYH:[id number] DOB: 1966-01-01 DOA: 11/11/2019 PCP: Patient, No Pcp Per   Brief Narrative: The patient is a 54 year old Caucasian disheveled male with no known past medical history as he is not see prior physicians who was brought to the ED via EMS after church members found him minimally responsive on his couch in his own urine and feces.  He had not moved from his couch for apparently 2 weeks and nonverbal at the time of his initial presentation.  He and his brother live in the same place and his brother had a similar situation.  Apparently drinks a lot of alcohol and most of the history was provided by the patient's pastor and who presented with acute metabolic encephalopathy which is now improved.  Psychiatry was consulted as well.  Wernicke's and alcohol withdrawal delirium and he is improving but still remains intermittently confused.  PT OT recommending SNF however patient is uninsured and currently has no safe discharge disposition so will remain in the hospital at this time.  Also the patient is refusing SNF and have consulted psychiatry for capacity evaluation and they have deemed the patient not having the mental capacity to make decisions for SNF placement. They also recommending starting Mirtazapine 7.5 mg po qHS   Assessment & Plan:   Principal Problem:   Acute metabolic encephalopathy Active Problems:   Alcohol abuse   Rhabdomyolysis   Hypomagnesemia   Catatonia   Dehydration   Pressure injury of skin   Malnutrition of moderate degree  Acute metabolic encephalopathy/catatonia/Wernicke's/alcohol withdrawal delirium -Patient did not have any focal deficits.   -CT head was negative for acute findings.  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 -Patient was nonverbal in the emergency room. He did start speaking but noted to be very confused.  All of this appears to be secondary to Wernicke's.  Apparently drinks alcohol on a daily basis.  He likely also has  nutritional deficiencies on top of that.  He was noted to have nystagmus -Seen by psychiatry and did not feel that he needed inpatient psychiatric care. -Patient was started on high-dose thiamine.  He was also started on B12 supplementation.   -Folic acid level was 6.1.  TSH was normal.  RPR nonreactive.  HIV nonreactive.  Ammonia level less than 9. -Patient's mentation has improved but he still remains quite distracted. His alcohol withdrawal delirium seems to be better.    He likely has underlying cognitive deficits not only due to nutritional issues but also due to possible vascular issues based on findings on CT scan.  He still remains disoriented.   -Noted to be on Quetiapine and his dose has now increased to 50 mg po BID -PT/OT recommending SNF; TOC assisting with placement but has no safe options for D/C currently given that he is unisured and does NOT Have the mental capacity to refuse SNF -Patient refusing SNF and wanting to go home but has been intermittently confused  -Will get Psychiatry to evaluate for Capacity Evaluation and they have deemed him not to have mental capacity to Refuse SNF -Will need TOC assistance for SNF Placement and have next of kin, family or ethics for guardianship and decision making   Alcohol withdrawal syndrome in the setting of alcohol abuse -Alcohol withdrawal symptoms appears to have improved.  -Delirium is improving but he appears to have cognitive deficits which could be related to Wernicke's and B12 deficiency.    Mild Rhabdomyolysis -CK was noted to be 557.  Resolved with IV fluids.    Hypokalemia and Hypomagnesemia -Potassium has been norma and last K+ was 4.0  -Also on magnesium oxide -Last Mag was 1.9 -Continue to monitor and trend magnesium level as well as CMP -Repeat CMP and magnesium level intermittently   Bacteremia, likely contaminant -Bacillus species noted in 1 set.  He is afebrile.  His WBC is normal and he is afrebile.   -Likely a  contaminant.  Continue to monitor for now.  No antibiotics at this time.  Normocytic/Macrocytic Anemia -Drop in hemoglobin is noted and had been improving but dropped again.  Likely dilutional but hemoglobin/hematocrit went from 12.9/39.8 and now dropped to 8.5/26.1 on last check -Anemia panel done on 11/14/2019 and showed an iron level of 18, U IBC of 103, TIBC of 121, saturation ratios of 15%, ferritin level of 1368, folate level 6.1 -Continue to monitor for signs and symptoms bleeding; currently no overt bleeding noted -Repeat CBC in the AM -IF Continues to worsen will obtain FOBT  Vitamin B12 Deficiency -B12 level noted to be low normal at 264.  Started on supplementation.  Dehydration -Received IV fluids.   -Oral intake seems to have improved.  IV fluids discontinued.  Tobacco Abuse -Smoking Cessation counseling given -C/w Nicotine patch  Pressure injury, poA Pressure Injury 11/11/19 Hip Right Unstageable - Full thickness tissue loss in which the base of the injury is covered by slough (yellow, tan, gray, green or brown) and/or eschar (tan, brown or black) in the wound bed. (Active)  11/11/19 1815  Location: Hip  Location Orientation: Right  Staging: Unstageable - Full thickness tissue loss in which the base of the injury is covered by slough (yellow, tan, gray, green or brown) and/or eschar (tan, brown or black) in the wound bed.  Wound Description (Comments):   Present on Admission: Yes     Pressure Injury 11/11/19 Other (Comment) Left;Lateral Stage 2 -  Partial thickness loss of dermis presenting as a shallow open injury with a red, pink wound bed without slough. (Active)  11/11/19 1815  Location: Other (Comment)  Location Orientation: Left;Lateral  Staging: Stage 2 -  Partial thickness loss of dermis presenting as a shallow open injury with a red, pink wound bed without slough.  Wound Description (Comments):   Present on Admission: Yes   Nonsevere/Moderate malnutrition  and Underweight  -In the setting of Chronic Alcoholism and poor po intake  -Nutritionist consulted for further evaluation recommendations Continue with Ensure alive p.o. twice daily, Magic cup twice daily with meals, Juven fruit punch twice daily and will check magnesium and phosphorus level in a.m.  DVT prophylaxis: Enoxaparin 40 mg sq q24h Code Status: FULL CODE  Family Communication: No family present at bedside  Disposition Plan: Unsafe discharge disposition given his living conditions and have asked Psych for a Capacity Evaluation as he has refused SNF and that is what is recommended for him by PT/OT; Pysch has deemed him not to have mental capacity. He is also uninsured so that is another barrier to D/C as he does not have the capacity to refuse SNF placement  Status is: Inpatient  Remains inpatient appropriate because:Unsafe d/c plan and Inpatient level of care appropriate due to severity of illness  Dispo:  Patient From: Home  Planned Disposition: To be determined; Unsafe Discharge Disposition  Expected discharge date: 11/22/19  Medically stable for discharge: YES  Consultants:   Psychiatry, reconsulted    Procedures:  None  Antimicrobials:  Anti-infectives (From admission, onward)  None     Subjective: Seen and examined at bedside and he was sitting up watching television with no issues.  No nausea or vomiting.  States that his knee is still hurting a little bit.  No other concerns or complaints at this time.  Objective: Vitals:   11/23/19 0526 11/23/19 1410 11/23/19 2050 11/24/19 0543  BP: 100/70 122/88 118/81 122/66  Pulse: 92 100 (!) 101 98  Resp: 18 18 16 16   Temp: 98.1 F (36.7 C) 98.6 F (37 C) 98 F (36.7 C) 98.6 F (37 C)  TempSrc: Oral Oral Oral Oral  SpO2: 96% 97% 97% 96%  Weight:      Height:        Intake/Output Summary (Last 24 hours) at 11/24/2019 1159 Last data filed at 11/24/2019 0900 Gross per 24 hour  Intake 840 ml  Output 1400 ml  Net  -560 ml   Filed Weights   11/11/19 1957  Weight: 54.1 kg   Examination: Physical Exam:  Constitutional: Patient is a thin disheveled Caucasian male currently in no acute distress appears calm and comfortable watching TV Respiratory: Diminished to auscultation bilaterally no appreciable wheezing, rales, rhonchi. Cardiovascular: Slightly tachycardic but regular rate and rhythm.  No extremity edema noted Abdomen: Soft, nontender, nondistended.  Bowel sounds present Skin: No appreciable rashes or lesions on limited skin evaluation Neurologic: Cranial nerves II through XII grossly intact  Data Reviewed: I have personally reviewed following labs and imaging studies  CBC: Recent Labs  Lab 11/18/19 0619 11/20/19 0557 11/21/19 0543  WBC 5.8 6.5 6.6  NEUTROABS  --   --  3.7  HGB 12.9* 8.4* 8.5*  HCT 39.8 26.0* 26.1*  MCV 100.0 101.6* 100.4*  PLT 208 245 253   Basic Metabolic Panel: Recent Labs  Lab 11/18/19 0619 11/20/19 0557 11/21/19 0543  NA 135 136 135  K 4.7 3.9 4.0  CL 99 103 102  CO2 27 26 25   GLUCOSE 96 96 90  BUN 15 18 16   CREATININE 0.66 0.59* 0.65  CALCIUM 9.2 9.2 9.4  MG 1.8 1.7 1.9  PHOS  --   --  3.8   GFR: Estimated Creatinine Clearance: 80.8 mL/min (by C-G formula based on SCr of 0.65 mg/dL). Liver Function Tests: Recent Labs  Lab 11/20/19 0557 11/21/19 0543  AST 30 36  ALT 27 32  ALKPHOS 44 47  BILITOT 0.3 0.4  PROT 7.4 7.9  ALBUMIN 2.6* 2.8*   No results for input(s): LIPASE, AMYLASE in the last 168 hours. No results for input(s): AMMONIA in the last 168 hours. Coagulation Profile: No results for input(s): INR, PROTIME in the last 168 hours. Cardiac Enzymes: No results for input(s): CKTOTAL, CKMB, CKMBINDEX, TROPONINI in the last 168 hours. BNP (last 3 results) No results for input(s): PROBNP in the last 8760 hours. HbA1C: No results for input(s): HGBA1C in the last 72 hours. CBG: No results for input(s): GLUCAP in the last 168  hours. Lipid Profile: No results for input(s): CHOL, HDL, LDLCALC, TRIG, CHOLHDL, LDLDIRECT in the last 72 hours. Thyroid Function Tests: No results for input(s): TSH, T4TOTAL, FREET4, T3FREE, THYROIDAB in the last 72 hours. Anemia Panel: No results for input(s): VITAMINB12, FOLATE, FERRITIN, TIBC, IRON, RETICCTPCT in the last 72 hours. Sepsis Labs: No results for input(s): PROCALCITON, LATICACIDVEN in the last 168 hours.  No results found for this or any previous visit (from the past 240 hour(s)).  RN Pressure Injury Documentation: Pressure Injury 11/11/19 Hip Right Unstageable - Full thickness  tissue loss in which the base of the injury is covered by slough (yellow, tan, gray, green or brown) and/or eschar (tan, brown or black) in the wound bed. (Active)  11/11/19 1815  Location: Hip  Location Orientation: Right  Staging: Unstageable - Full thickness tissue loss in which the base of the injury is covered by slough (yellow, tan, gray, green or brown) and/or eschar (tan, brown or black) in the wound bed.  Wound Description (Comments):   Present on Admission: Yes     Pressure Injury 11/11/19 Other (Comment) Left;Lateral Stage 2 -  Partial thickness loss of dermis presenting as a shallow open injury with a red, pink wound bed without slough. (Active)  11/11/19 1815  Location: Other (Comment)  Location Orientation: Left;Lateral  Staging: Stage 2 -  Partial thickness loss of dermis presenting as a shallow open injury with a red, pink wound bed without slough.  Wound Description (Comments):   Present on Admission: Yes   Estimated body mass index is 16.63 kg/m as calculated from the following:   Height as of this encounter: 5\' 11"  (1.803 m).   Weight as of this encounter: 54.1 kg.  Malnutrition Type:  Nutrition Problem: Moderate Malnutrition Etiology: social / environmental circumstances (ETOH abuse)   Malnutrition Characteristics:  Signs/Symptoms: percent weight loss, moderate fat  depletion, moderate muscle depletion Percent weight loss: 22 % (x 9 months)   Nutrition Interventions:  Interventions: Ensure Enlive (each supplement provides 350kcal and 20 grams of protein), Magic cup   Radiology Studies: No results found.  Scheduled Meds: . enoxaparin (LOVENOX) injection  40 mg Subcutaneous Q24H  . feeding supplement (ENSURE ENLIVE)  237 mL Oral BID BM  . folic acid  1 mg Oral Daily  . magnesium oxide  400 mg Oral BID  . mirtazapine  7.5 mg Oral QHS  . multivitamin with minerals  1 tablet Oral Daily  . nicotine  14 mg Transdermal Daily  . nutrition supplement (JUVEN)  1 packet Oral BID BM  . QUEtiapine  50 mg Oral BID  . thiamine  100 mg Oral Daily  . vitamin B-12  1,000 mcg Oral Daily   Continuous Infusions:   LOS: 12 days   , DO [facility] PAGER is on AMION  If 7PM-7AM, please contact night-coverage [email or web address]

## 2019-11-25 LAB — COMPREHENSIVE METABOLIC PANEL
ALT: 45 U/L — ABNORMAL HIGH (ref 0–44)
AST: 41 U/L (ref 15–41)
Albumin: 2.8 g/dL — ABNORMAL LOW (ref 3.5–5.0)
Alkaline Phosphatase: 47 U/L (ref 38–126)
Anion gap: 9 (ref 5–15)
BUN: 17 mg/dL (ref 6–20)
CO2: 25 mmol/L (ref 22–32)
Calcium: 9.4 mg/dL (ref 8.9–10.3)
Chloride: 105 mmol/L (ref 98–111)
Creatinine, Ser: 0.62 mg/dL (ref 0.61–1.24)
GFR calc Af Amer: >60 mL/min (ref >60)
GFR calc non Af Amer: >60 mL/min (ref >60)
Glucose, Bld: 93 mg/dL (ref 70–99)
Potassium: 4 mmol/L (ref 3.5–5.1)
Sodium: 139 mmol/L (ref 135–145)
Total Bilirubin: 0.4 mg/dL (ref 0.3–1.2)
Total Protein: 7.9 g/dL (ref 6.5–8.1)

## 2019-11-25 LAB — CBC WITH DIFFERENTIAL/PLATELET
Abs Immature Granulocytes: 0.13 10*3/uL — ABNORMAL HIGH (ref 0.00–0.07)
Basophils Absolute: 0 10*3/uL (ref 0.0–0.1)
Basophils Relative: 1 %
Eosinophils Absolute: 0.2 10*3/uL (ref 0.0–0.5)
Eosinophils Relative: 2 %
HCT: 27.9 % — ABNORMAL LOW (ref 39.0–52.0)
Hemoglobin: 8.8 g/dL — ABNORMAL LOW (ref 13.0–17.0)
Immature Granulocytes: 2 %
Lymphocytes Relative: 32 %
Lymphs Abs: 2.1 10*3/uL (ref 0.7–4.0)
MCH: 32.4 pg (ref 26.0–34.0)
MCHC: 31.5 g/dL (ref 30.0–36.0)
MCV: 102.6 fL — ABNORMAL HIGH (ref 80.0–100.0)
Monocytes Absolute: 0.8 10*3/uL (ref 0.1–1.0)
Monocytes Relative: 12 %
Neutro Abs: 3.4 10*3/uL (ref 1.7–7.7)
Neutrophils Relative %: 51 %
Platelets: 306 10*3/uL (ref 150–400)
RBC: 2.72 MIL/uL — ABNORMAL LOW (ref 4.22–5.81)
RDW: 14.3 % (ref 11.5–15.5)
WBC: 6.5 10*3/uL (ref 4.0–10.5)
nRBC: 0 % (ref 0.0–0.2)

## 2019-11-25 LAB — PHOSPHORUS: Phosphorus: 4 mg/dL (ref 2.5–4.6)

## 2019-11-25 LAB — MAGNESIUM: Magnesium: 1.9 mg/dL (ref 1.7–2.4)

## 2019-11-25 NOTE — Progress Notes (Signed)
PROGRESS NOTE    Randall Garcia  KYH:[id number] DOB: 1966-01-01 DOA: 11/11/2019 PCP: Patient, No Pcp Per   Brief Narrative: The patient is a 54 year old Caucasian disheveled male with no known past medical history as he is not see prior physicians who was brought to the ED via EMS after church members found him minimally responsive on his couch in his own urine and feces.  He had not moved from his couch for apparently 2 weeks and nonverbal at the time of his initial presentation.  He and his brother live in the same place and his brother had a similar situation.  Apparently drinks a lot of alcohol and most of the history was provided by the patient's pastor and who presented with acute metabolic encephalopathy which is now improved.  Psychiatry was consulted as well.  Wernicke's and alcohol withdrawal delirium and he is improving but still remains intermittently confused.  PT OT recommending SNF however patient is uninsured and currently has no safe discharge disposition so will remain in the hospital at this time.  Also the patient is refusing SNF and have consulted psychiatry for capacity evaluation and they have deemed the patient not having the mental capacity to make decisions for SNF placement. They also recommending starting Mirtazapine 7.5 mg po qHS   Assessment & Plan:   Principal Problem:   Acute metabolic encephalopathy Active Problems:   Alcohol abuse   Rhabdomyolysis   Hypomagnesemia   Catatonia   Dehydration   Pressure injury of skin   Malnutrition of moderate degree  Acute metabolic encephalopathy/catatonia/Wernicke's/alcohol withdrawal delirium -Patient did not have any focal deficits.   -CT head was negative for acute findings.  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 -Patient was nonverbal in the emergency room. He did start speaking but noted to be very confused.  All of this appears to be secondary to Wernicke's.  Apparently drinks alcohol on a daily basis.  He likely also has  nutritional deficiencies on top of that.  He was noted to have nystagmus -Seen by psychiatry and did not feel that he needed inpatient psychiatric care. -Patient was started on high-dose thiamine.  He was also started on B12 supplementation.   -Folic acid level was 6.1.  TSH was normal.  RPR nonreactive.  HIV nonreactive.  Ammonia level less than 9. -Patient's mentation has improved but he still remains quite distracted. His alcohol withdrawal delirium seems to be better.    He likely has underlying cognitive deficits not only due to nutritional issues but also due to possible vascular issues based on findings on CT scan.  He still remains disoriented.   -Noted to be on Quetiapine and his dose has now increased to 50 mg po BID -PT/OT recommending SNF; TOC assisting with placement but has no safe options for D/C currently given that he is unisured and does NOT Have the mental capacity to refuse SNF -Patient refusing SNF and wanting to go home but has been intermittently confused  -Will get Psychiatry to evaluate for Capacity Evaluation and they have deemed him not to have mental capacity to Refuse SNF -Will need TOC assistance for SNF Placement and have next of kin, family or ethics for guardianship and decision making   Alcohol withdrawal syndrome in the setting of alcohol abuse -Alcohol withdrawal symptoms appears to have improved.  -Delirium is improving but he appears to have cognitive deficits which could be related to Wernicke's and B12 deficiency.    Mild Rhabdomyolysis -CK was noted to be 557.  Resolved with IV fluids.    Hypokalemia and Hypomagnesemia -Potassium has been norma and last K+ was 4.0 (checked 11/25/19) -Also on magnesium oxide -Last Mag was 1.9 (checked 11/25/19) -Continue to monitor and trend magnesium level as well as CMP -Repeat CMP and magnesium level intermittently   Bacteremia, likely contaminant -Bacillus species noted in 1 set.  He is afebrile.  His WBC is normal  and he is afrebile.   -Likely a contaminant.  Continue to monitor for now.  No antibiotics at this time.  Normocytic/Macrocytic Anemia -Drop in hemoglobin is noted and had been improving but dropped again.  Likely dilutional but hemoglobin/hematocrit went from 12.9/39.8 and now dropped to 8.8/27.9 on last check (11/25/19) -Anemia panel done on 11/14/2019 and showed an iron level of 18, U IBC of 103, TIBC of 121, saturation ratios of 15%, ferritin level of 1368, folate level 6.1 -Continue to monitor for signs and symptoms bleeding; currently no overt bleeding noted -Repeat CBC in the AM -IF Continues to worsen will obtain FOBT  Elevated ALT -Mild -ALT went from 27 -> 32 -> 45 -Continue to Monitor Intermittently   Vitamin B12 Deficiency -B12 level noted to be low normal at 264.  Started on supplementation.  Dehydration -Received IV fluids.   -Oral intake seems to have improved.  IV fluids discontinued.  Tobacco Abuse -Smoking Cessation counseling given -C/w Nicotine patch  Pressure injury, poA Pressure Injury 11/11/19 Hip Right Unstageable - Full thickness tissue loss in which the base of the injury is covered by slough (yellow, tan, gray, green or brown) and/or eschar (tan, brown or black) in the wound bed. (Active)  11/11/19 1815  Location: Hip  Location Orientation: Right  Staging: Unstageable - Full thickness tissue loss in which the base of the injury is covered by slough (yellow, tan, gray, green or brown) and/or eschar (tan, brown or black) in the wound bed.  Wound Description (Comments):   Present on Admission: Yes     Pressure Injury 11/11/19 Other (Comment) Left;Lateral Stage 2 -  Partial thickness loss of dermis presenting as a shallow open injury with a red, pink wound bed without slough. (Active)  11/11/19 1815  Location: Other (Comment)  Location Orientation: Left;Lateral  Staging: Stage 2 -  Partial thickness loss of dermis presenting as a shallow open injury with a  red, pink wound bed without slough.  Wound Description (Comments):   Present on Admission: Yes   Nonsevere/Moderate malnutrition and Underweight  -In the setting of Chronic Alcoholism and poor po intake  -Nutritionist consulted for further evaluation recommendations Continue with Ensure alive p.o. twice daily, Magic cup twice daily with meals, Juven fruit punch twice daily and will check magnesium and phosphorus level in a.m.  DVT prophylaxis: Enoxaparin 40 mg sq q24h Code Status: FULL CODE  Family Communication: No family present at bedside  Disposition Plan: Unsafe discharge disposition given his living conditions and have asked Psych for a Capacity Evaluation as he has refused SNF and that is what is recommended for him by PT/OT; Pysch has deemed him not to have mental capacity. He is also uninsured so that is another barrier to D/C as he does not have the capacity to refuse SNF placement  Status is: Inpatient  Remains inpatient appropriate because:Unsafe d/c plan and Inpatient level of care appropriate due to severity of illness  Dispo:  Patient From: Home  Planned Disposition: To be determined; Unsafe Discharge Disposition  Expected discharge date: 11/22/19  Medically stable for discharge: YES  Consultants:   Psychiatry, reconsulted    Procedures:  None  Antimicrobials:  Anti-infectives (From admission, onward)   None     Subjective: Seen and examined at bedside and he was in the bed watching television in no acute distress.  He denies any complaints but states that he saw some intermittent knee pain.  No other concerns or complaints at this time.  Objective: Vitals:   11/24/19 0543 11/24/19 1324 11/24/19 2044 11/25/19 0402  BP: 122/66 99/67 116/78 129/89  Pulse: 98 96 (!) 106 93  Resp: 16 16 18 18   Temp: 98.6 F (37 C) 98.4 F (36.9 C) 98.4 F (36.9 C) 98.2 F (36.8 C)  TempSrc: Oral Oral Oral Oral  SpO2: 96% 98% 96% 96%  Weight:      Height:         Intake/Output Summary (Last 24 hours) at 11/25/2019 1150 Last data filed at 11/25/2019 1130 Gross per 24 hour  Intake 480 ml  Output 900 ml  Net -420 ml   Filed Weights   11/11/19 1957  Weight: 54.1 kg   Examination: Physical Exam:  Constitutional: The patient is a thin disheveled Caucasian male currently in no acute distress appears calm and comfortable watching television Respiratory: Diminished to auscultation bilaterally no appreciable wheezing, rales, rhonchi. Cardiovascular: Regular rate and rhythm slight on the faster side.  No appreciable murmurs, rubs, gallops. Abdomen: Soft, nontender, nondistended.  Vessels present  Skin: No appreciable rashes or lesions on a limited skin evaluation Neurologic: Cranial nerves II through XII grossly intact.  Data Reviewed: I have personally reviewed following labs and imaging studies  CBC: Recent Labs  Lab 11/20/19 0557 11/21/19 0543 11/25/19 0608  WBC 6.5 6.6 6.5  NEUTROABS  --  3.7 3.4  HGB 8.4* 8.5* 8.8*  HCT 26.0* 26.1* 27.9*  MCV 101.6* 100.4* 102.6*  PLT 245 253 306   Basic Metabolic Panel: Recent Labs  Lab 11/20/19 0557 11/21/19 0543 11/25/19 0608  NA 136 135 139  K 3.9 4.0 4.0  CL 103 102 105  CO2 26 25 25   GLUCOSE 96 90 93  BUN 18 16 17   CREATININE 0.59* 0.65 0.62  CALCIUM 9.2 9.4 9.4  MG 1.7 1.9 1.9  PHOS  --  3.8 4.0   GFR: Estimated Creatinine Clearance: 80.8 mL/min (by C-G formula based on SCr of 0.62 mg/dL). Liver Function Tests: Recent Labs  Lab 11/20/19 0557 11/21/19 0543 11/25/19 0608  AST 30 36 41  ALT 27 32 45*  ALKPHOS 44 47 47  BILITOT 0.3 0.4 0.4  PROT 7.4 7.9 7.9  ALBUMIN 2.6* 2.8* 2.8*   No results for input(s): LIPASE, AMYLASE in the last 168 hours. No results for input(s): AMMONIA in the last 168 hours. Coagulation Profile: No results for input(s): INR, PROTIME in the last 168 hours. Cardiac Enzymes: No results for input(s): CKTOTAL, CKMB, CKMBINDEX, TROPONINI in the last  168 hours. BNP (last 3 results) No results for input(s): PROBNP in the last 8760 hours. HbA1C: No results for input(s): HGBA1C in the last 72 hours. CBG: No results for input(s): GLUCAP in the last 168 hours. Lipid Profile: No results for input(s): CHOL, HDL, LDLCALC, TRIG, CHOLHDL, LDLDIRECT in the last 72 hours. Thyroid Function Tests: No results for input(s): TSH, T4TOTAL, FREET4, T3FREE, THYROIDAB in the last 72 hours. Anemia Panel: No results for input(s): VITAMINB12, FOLATE, FERRITIN, TIBC, IRON, RETICCTPCT in the last 72 hours. Sepsis Labs: No results for input(s): PROCALCITON, LATICACIDVEN in the last 168 hours.  No results found for this or any previous visit (from the past 240 hour(s)).  RN Pressure Injury Documentation: Pressure Injury 11/11/19 Hip Right Unstageable - Full thickness tissue loss in which the base of the injury is covered by slough (yellow, tan, gray, green or brown) and/or eschar (tan, brown or black) in the wound bed. (Active)  11/11/19 1815  Location: Hip  Location Orientation: Right  Staging: Unstageable - Full thickness tissue loss in which the base of the injury is covered by slough (yellow, tan, gray, green or brown) and/or eschar (tan, brown or black) in the wound bed.  Wound Description (Comments):   Present on Admission: Yes     Pressure Injury 11/11/19 Other (Comment) Left;Lateral Stage 2 -  Partial thickness loss of dermis presenting as a shallow open injury with a red, pink wound bed without slough. (Active)  11/11/19 1815  Location: Other (Comment)  Location Orientation: Left;Lateral  Staging: Stage 2 -  Partial thickness loss of dermis presenting as a shallow open injury with a red, pink wound bed without slough.  Wound Description (Comments):   Present on Admission: Yes   Estimated body mass index is 16.63 kg/m as calculated from the following:   Height as of this encounter: 5\' 11"  (1.803 m).   Weight as of this encounter: 54.1  kg.  Malnutrition Type:  Nutrition Problem: Moderate Malnutrition Etiology: social / environmental circumstances (ETOH abuse)   Malnutrition Characteristics:  Signs/Symptoms: percent weight loss, moderate fat depletion, moderate muscle depletion Percent weight loss: 22 % (x 9 months)   Nutrition Interventions:  Interventions: Ensure Enlive (each supplement provides 350kcal and 20 grams of protein), Magic cup   Radiology Studies: No results found.  Scheduled Meds: . enoxaparin (LOVENOX) injection  40 mg Subcutaneous Q24H  . feeding supplement (ENSURE ENLIVE)  237 mL Oral BID BM  . folic acid  1 mg Oral Daily  . magnesium oxide  400 mg Oral BID  . mirtazapine  7.5 mg Oral QHS  . multivitamin with minerals  1 tablet Oral Daily  . nicotine  14 mg Transdermal Daily  . nutrition supplement (JUVEN)  1 packet Oral BID BM  . QUEtiapine  50 mg Oral BID  . thiamine  100 mg Oral Daily  . vitamin B-12  1,000 mcg Oral Daily   Continuous Infusions:   LOS: 13 days   , DO [facility] PAGER is on AMION  If 7PM-7AM, please contact night-coverage [email or web address]

## 2019-11-26 MED ORDER — MIRTAZAPINE 7.5 MG PO TABS: 7.5000 mg | ORAL_TABLET | Freq: Every day | ORAL | 0 refills | Status: DC

## 2019-11-26 MED ORDER — BISACODYL 5 MG PO TBEC: 5.0000 mg | DELAYED_RELEASE_TABLET | Freq: Every day | ORAL | 0 refills | Status: AC | PRN

## 2019-11-26 MED ORDER — QUETIAPINE FUMARATE 50 MG PO TABS: 50.0000 mg | ORAL_TABLET | Freq: Two times a day (BID) | ORAL | 0 refills | Status: AC

## 2019-11-26 MED ORDER — THIAMINE HCL 100 MG PO TABS: 100.0000 mg | ORAL_TABLET | Freq: Every day | ORAL | 0 refills | Status: DC

## 2019-11-26 MED ORDER — BISACODYL 5 MG PO TBEC: 5.0000 mg | DELAYED_RELEASE_TABLET | Freq: Every day | ORAL | 0 refills | Status: DC | PRN

## 2019-11-26 MED ORDER — ENSURE ENLIVE PO LIQD: 237.0000 mL | Freq: Two times a day (BID) | ORAL | 12 refills | Status: DC

## 2019-11-26 MED ORDER — FOLIC ACID 1 MG PO TABS: 1.0000 mg | ORAL_TABLET | Freq: Every day | ORAL | 0 refills | Status: AC

## 2019-11-26 MED ORDER — NICOTINE 14 MG/24HR TD PT24: 14.0000 mg | MEDICATED_PATCH | Freq: Every day | TRANSDERMAL | 0 refills | Status: DC

## 2019-11-26 MED ORDER — MIRTAZAPINE 7.5 MG PO TABS: 7.5000 mg | ORAL_TABLET | Freq: Every day | ORAL | 0 refills | Status: AC

## 2019-11-26 MED ORDER — ADULT MULTIVITAMIN W/MINERALS CH: 1.0000 | ORAL_TABLET | Freq: Every day | ORAL | 0 refills | Status: AC

## 2019-11-26 MED ORDER — ACETAMINOPHEN 325 MG PO TABS: 650.0000 mg | ORAL_TABLET | Freq: Four times a day (QID) | ORAL | 0 refills | Status: AC | PRN

## 2019-11-26 MED ORDER — QUETIAPINE FUMARATE 50 MG PO TABS: 50.0000 mg | ORAL_TABLET | Freq: Two times a day (BID) | ORAL | 0 refills | Status: DC

## 2019-11-26 MED ORDER — ONDANSETRON HCL 4 MG PO TABS: 4.0000 mg | ORAL_TABLET | Freq: Four times a day (QID) | ORAL | 0 refills | Status: DC | PRN

## 2019-11-26 MED ORDER — ONDANSETRON HCL 4 MG PO TABS: 4.0000 mg | ORAL_TABLET | Freq: Four times a day (QID) | ORAL | 0 refills | Status: AC | PRN

## 2019-11-26 MED ORDER — FOLIC ACID 1 MG PO TABS: 1.0000 mg | ORAL_TABLET | Freq: Every day | ORAL | 0 refills | Status: DC

## 2019-11-26 MED ORDER — MAGNESIUM OXIDE 400 (241.3 MG) MG PO TABS: 400.0000 mg | ORAL_TABLET | Freq: Two times a day (BID) | ORAL | 0 refills | Status: AC

## 2019-11-26 MED ORDER — ENSURE ENLIVE PO LIQD: 237.0000 mL | Freq: Two times a day (BID) | ORAL | 12 refills | Status: AC

## 2019-11-26 MED ORDER — NICOTINE 14 MG/24HR TD PT24: 14.0000 mg | MEDICATED_PATCH | Freq: Every day | TRANSDERMAL | 0 refills | Status: AC

## 2019-11-26 MED ORDER — THIAMINE HCL 100 MG PO TABS: 100.0000 mg | ORAL_TABLET | Freq: Every day | ORAL | 0 refills | Status: AC

## 2019-11-26 MED ORDER — ACETAMINOPHEN 325 MG PO TABS: 650.0000 mg | ORAL_TABLET | Freq: Four times a day (QID) | ORAL | 0 refills | Status: DC | PRN

## 2019-11-26 MED ORDER — JUVEN PO PACK: 1.0000 | PACK | Freq: Two times a day (BID) | ORAL | 0 refills | Status: AC

## 2019-11-26 MED ORDER — ADULT MULTIVITAMIN W/MINERALS CH: 1.0000 | ORAL_TABLET | Freq: Every day | ORAL | 0 refills | Status: DC

## 2019-11-26 MED ORDER — JUVEN PO PACK: 1.0000 | PACK | Freq: Two times a day (BID) | ORAL | 0 refills | Status: DC

## 2019-11-26 MED ORDER — CYANOCOBALAMIN 1000 MCG PO TABS: 1000.0000 ug | ORAL_TABLET | Freq: Every day | ORAL | 0 refills | Status: AC

## 2019-11-26 MED ORDER — MAGNESIUM OXIDE 400 (241.3 MG) MG PO TABS: 400.0000 mg | ORAL_TABLET | Freq: Two times a day (BID) | ORAL | 0 refills | Status: DC

## 2019-11-26 MED ORDER — CYANOCOBALAMIN 1000 MCG PO TABS: 1000.0000 ug | ORAL_TABLET | Freq: Every day | ORAL | 0 refills | Status: DC

## 2019-11-26 NOTE — Progress Notes (Signed)
Occupational Therapy Treatment Patient Details Name: Randall Garcia MRN: [id number] DOB: 1965-12-10 Today's Date: 11/26/2019    History of present illness 54 y.o. male with no known past medical history (never sees doctors) who was brought to the ED via EMS after church members found patient minimally responsive, on his couch in his own urine and feces where he had apparently not moved from for at least 2 weeks. Diagnosis: acute met encephalopahty, rhabdomyolysis.   OT comments  Pt declined a bath  Follow Up Recommendations  SNF;Supervision/Assistance - 24 hour          Precautions / Restrictions Precautions Precautions: Fall Precaution Comments: sacral and right hip wound per RN notes; lacks capacity per psych Restrictions Weight Bearing Restrictions: No       Mobility Bed Mobility Overal bed mobility: Needs Assistance Bed Mobility: Supine to Sit     Supine to sit: Min guard;HOB elevated;Supervision     General bed mobility comments: min guard for safety; increased time  Transfers Overall transfer level: Needs assistance Equipment used: Rolling walker (2 wheeled) Transfers: Sit to/from [facility] Sit to Stand: From elevated surface;Supervision Stand pivot transfers: Supervision;From elevated surface       General transfer comment: cues for safe hand placement    Balance Overall balance assessment: Needs assistance Sitting-balance support: No upper extremity supported;Feet supported Sitting balance-Leahy Scale: Good     Standing balance support: Bilateral upper extremity supported;During functional activity Standing balance-Leahy Scale: Fair Standing balance comment: required use of RW                           ADL either performed or assessed with clinical judgement   ADL Overall ADL's : Needs assistance/impaired     Grooming: Wash/dry hands;Standing;Supervision/safety                   Toilet Transfer: Minimal  assistance;Comfort height toilet;RW;Cueing for sequencing;Cueing for safety   Toileting- Clothing Manipulation and Hygiene: Minimal assistance;Sit to/from stand       Functional mobility during ADLs: Minimal assistance;Rolling walker       Vision Patient Visual Report: No change from baseline            Cognition Arousal/Alertness: Awake/alert Behavior During Therapy: WFL for tasks assessed/performed Overall Cognitive Status: No family/caregiver present to determine baseline cognitive functioning Area of Impairment: Safety/judgement;Orientation                 Orientation Level: Disoriented to;Time;Situation   Memory: Decreased short-term memory Following Commands: Follows one step commands consistently Safety/Judgement: Decreased awareness of safety;Decreased awareness of deficits     General Comments: increased time for mobility and to process commands                   Pertinent Vitals/ Pain       Pain Assessment: No/denies pain Faces Pain Scale: Hurts little more (When asked to rate pain, pt stated, "I couldn't really tell you.") Pain Location: L knee with prolonged walking, no pain at rest Pain Descriptors / Indicators: Discomfort Pain Intervention(s): Limited activity within patient's tolerance;Monitored during session         Frequency  Min 2X/week        Progress Toward Goals  OT Goals(current goals can now be found in the care plan section)  Progress towards OT goals: Progressing toward goals  Acute Rehab OT Goals Patient Stated Goal: to get stronger  Plan Discharge plan remains  appropriate       AM-PAC OT "6 Clicks" Daily Activity     Outcome Measure   Help from another person eating meals?: None Help from another person taking care of personal grooming?: A Little Help from another person toileting, which includes using toliet, bedpan, or urinal?: A Little Help from another person bathing (including washing, rinsing, drying)?: A  Little Help from another person to put on and taking off regular upper body clothing?: A Little Help from another person to put on and taking off regular lower body clothing?: A Lot 6 Click Score: 18    End of Session Equipment Utilized During Treatment: Gait belt;Rolling walker  OT Visit Diagnosis: Unsteadiness on feet (R26.81);Muscle weakness (generalized) (M62.81);Other symptoms and signs involving cognitive function   Activity Tolerance Patient tolerated treatment well   Patient Left in chair;with call bell/phone within reach;with chair alarm set   Nurse Communication  (nurse ok therapy)        Time: 5331-7409 OT Time Calculation (min): 16 min  Charges: OT General Charges $OT Visit: 1 Visit OT Treatments $Self Care/Home Management : 8-22 mins  Kari Baars, [place] [phone] Office- [phone]      Julieth Tugman, Edwena Felty D 11/26/2019, 12:31 PM
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
## 2019-11-26 NOTE — Discharge Summary (Signed)
Physician Discharge Summary  Randall Garcia:[id number] DOB: 02-Jan-1966 DOA: 11/11/2019  PCP: Patient, No Pcp Per  Admit date: 11/11/2019 Discharge date: 11/26/2019  Admitted From: Home Disposition: Home with Home Health (Patient's Brother has elected to take him home and no longer wants SNF as patient has no mental capacity to make medical decisions)  Recommendations for Outpatient Follow-up:  1. Follow up with PCP in 1-2 weeks 2. Follow up with Psychiatry within 1-2 weeks  3. Please obtain BMP/CBC in one week 4. Please follow up on the following pending results:  Home Health: YES Equipment/Devices: Agricultural consultant with 5" Wheels; 3in1 Bedside Commode  Discharge Condition: Stable CODE STATUS: FULL CODE Diet recommendation: Heart Healthy Diet   Brief/Interim Summary: The patient is a 54 year old Caucasian disheveled male with no known past medical history as he is not see prior physicians who was brought to the ED via EMS after church members found him minimally responsive on his couch in his own urine and feces.  He had not moved from his couch for apparently 2 weeks and nonverbal at the time of his initial presentation.  He and his brother live in the same place and his brother had a similar situation but his brother is of sound mind. They live "off the grid" and have no electricity and have chosen to live this way.    Apparently drinks a lot of alcohol and most of the history was provided by the patient's pastor and who presented with acute metabolic encephalopathy which is now improved.  Psychiatry was consulted as well.  Wernicke's and alcohol withdrawal delirium and he is improving but still remains intermittently confused.  PT OT recommending SNF however patient is uninsured and currently had no safe discharge disposition. Also the patient is refusing SNF and have consulted psychiatry for capacity evaluation and they have deemed the patient not having the mental capacity to make  decisions for SNF placement. They also recommending starting Mirtazapine 7.5 mg po qHS which has been done.  TOC is trying to find placement for this patient and is been difficult however today the patient's brother who is of sound mind does not want the patient going to a skilled nursing facility and will now make his medical decisions for him.  Patient's mother wants the patient home and he states that he can wash the patient 24 hours and call 911 if anything happens.  Patient's pastor will check on him frequently as well and patient will have 24-hour supervision and assistance.  We will have APS formally see the patient in outpatient setting and also arrange home health PT OT and send out a Child psychotherapist.  Patient has been deemed medically stable to be discharged from the hospital in his post go to a SNF however no options were available so patient's brother has elected to take him home and will watch him 24/7.  Patient was advised to quit drinking and will need to follow-up with PCP as well as psychiatry in outpatient setting.  Discharge Diagnoses:  Principal Problem:   Acute metabolic encephalopathy Active Problems:   Alcohol abuse   Rhabdomyolysis   Hypomagnesemia   Catatonia   Dehydration   Pressure injury of skin   Malnutrition of moderate degree  Acute metabolic encephalopathy/catatonia/Wernicke's/alcohol withdrawal delirium -Patient did not have any focal deficits.  -CT head was negative for acute findings. 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 -Patient was nonverbal in the emergency room. He did start speaking but noted to be very confused. All of  this appears to be secondary to Wernicke's. Apparently drinks alcohol on a daily basis. He likely also has nutritional deficiencies on top of that. He was noted to have nystagmus -Seen by psychiatry and did not feel that he needed inpatient psychiatric care. -Patient was started on high-dose thiamine. He was also started on B12 supplementation.   -Folic acid level was 6.1. TSH was normal. RPR nonreactive. HIV nonreactive. Ammonia level less than 9. -Patient's mentation has improved but he still remains quite distracted.His alcohol withdrawal delirium seems to be better.He likely has underlying cognitive deficits not only due to nutritional issues but also due to possible vascular issues based on findings on CT scan. He still remains disoriented.  -Noted to be on Quetiapine and his dose has now increased to 50 mg po BID -PT/OT recommending SNF; TOC assisting with placement but had no safe options for D/C currently given that he is unisured and does NOT Have the mental capacity to refuse SNF -Patient refusing SNF and wanting to go home but has been intermittently confused but brother has made a decision for him to go home -Will get Psychiatry to evaluate for Capacity Evaluation and they have deemed him not to have mental capacity to Refuse SNF -Will need TOC assistance for SNF Placement and have next of kin, family or ethics for guardianship and decision making; TOC still has not found placement for the patient yet and the patient's brother has now made the decision to take him home.  Patient's brother feels that he can take care of him at home and be with him 24/7 and will call 911 if he needs help.  Patient's pastor also can take him to and from appointments and will check on him frequently.  Given that the patient cannot make his medical decisions and does not have the capacity, his brother has made the decision for him to go home and does not want the patient to go to a SNF anymore.  He will need to avoid drinking and need to follow-up with PCP as well as psychiatry in the outpatient setting.  Alcohol withdrawal syndrome in the setting of alcohol abuse -Alcohol withdrawal symptoms appears to have improved.  -Delirium is improving but he appears to have cognitive deficits which could be related to Wernicke's and B12 deficiency.    Mild Rhabdomyolysis -CK was noted to be 557. Resolved with IV fluids.   Hypokalemia and Hypomagnesemia -Potassium has been norma and last K+ was 4.0 (checked 11/25/19) -Also on magnesium oxide -Last Mag was 1.9 (checked 11/25/19) -Continue to monitor and trend magnesium level as well as CMP -Repeat CMP and magnesium level intermittently   Bacteremia, likely contaminant -Bacillus species noted in 1 set. He is afebrile. His WBC is normal and he is afrebile.  -Likely a contaminant. Continue to monitor for now. No antibiotics at this time.  Normocytic/Macrocytic Anemia -Drop in hemoglobin is noted and had been improving but dropped again.  Likely dilutional but hemoglobin/hematocrit went from 12.9/39.8 and now dropped to 8.8/27.9 on last check (11/25/19) -Anemia panel done on 11/14/2019 and showed an iron level of 18, U IBC of 103, TIBC of 121, saturation ratios of 15%, ferritin level of 1368, folate level 6.1 -Continue to monitor for signs and symptoms bleeding; currently no overt bleeding noted -Repeat CBC within 1 week -IF Continues to worsen will obtain FOBT  Elevated ALT -Mild -ALT went from 27 -> 32 -> 45 on last check on 11/25/2019 -Continue to Monitor Intermittently   Vitamin  B12 Deficiency -B12 level noted to be low normal at 264. Started on supplementation.  Dehydration -Received IV fluids.  -Oral intake seems to have improved. IV fluids discontinued.  Tobacco Abuse -Smoking Cessation counseling given -C/w Nicotine patch  Pressure injury, poA Pressure Injury 11/11/19 Hip Right Unstageable - Full thickness tissue loss in which the base of the injury is covered by slough (yellow, tan, gray, green or brown) and/or eschar (tan, brown or black) in the wound bed. (Active)  11/11/19 1815  Location: Hip  Location Orientation: Right  Staging: Unstageable - Full thickness tissue loss in which the base of the injury is covered by slough (yellow, tan, gray, green or  brown) and/or eschar (tan, brown or black) in the wound bed.  Wound Description (Comments):   Present on Admission: Yes    Pressure Injury 11/11/19 Other (Comment) Left;Lateral Stage 2 - Partial thickness loss of dermis presenting as a shallow open injury with a red, pink wound bed without slough. (Active)  11/11/19 1815  Location: Other (Comment)  Location Orientation: Left;Lateral  Staging: Stage 2 - Partial thickness loss of dermis presenting as a shallow open injury with a red, pink wound bed without slough.  Wound Description (Comments):   Present on Admission: Yes   Nonsevere/Moderate malnutrition and Underweight  -In the setting of Chronic Alcoholism and poor po intake  -Nutritionist consulted for further evaluation recommendations Continue with Ensure alive p.o. twice daily, Magic cup twice daily with meals, Juven fruit punch twice daily and will check magnesium and phosphorus level in a.m.  Discharge Instructions  Discharge Instructions    Call MD for:  difficulty breathing, headache or visual disturbances   Complete by: As directed    Call MD for:  extreme fatigue   Complete by: As directed    Call MD for:  hives   Complete by: As directed    Call MD for:  persistant dizziness or light-headedness   Complete by: As directed    Call MD for:  persistant nausea and vomiting   Complete by: As directed    Call MD for:  redness, tenderness, or signs of infection (pain, swelling, redness, odor or green/yellow discharge around incision site)   Complete by: As directed    Call MD for:  severe uncontrolled pain   Complete by: As directed    Call MD for:  temperature >100.4   Complete by: As directed    Diet - low sodium heart healthy   Complete by: As directed    Discharge instructions   Complete by: As directed    You were cared for by a hospitalist during your hospital stay. If you have any questions about your discharge medications or the care you received while you were  in the hospital after you are discharged, you can call the unit and ask to speak with the hospitalist on call if the hospitalist that took care of you is not available. Once you are discharged, your primary care physician will handle any further medical issues. Please note that NO REFILLS for any discharge medications will be authorized once you are discharged, as it is imperative that you return to your primary care physician (or establish a relationship with a primary care physician if you do not have one) for your aftercare needs so that they can reassess your need for medications and monitor your lab values.  Follow up with PCP and Psychiatry. Take all medications as prescribed and avoid alcohol. If symptoms change or worsen please  return to the ED for evaluation   Discharge wound care:   Complete by: As directed    Per WOC nurse   Increase activity slowly   Complete by: As directed      Allergies as of 11/26/2019   No Known Allergies     Medication List    TAKE these medications   acetaminophen 325 MG tablet Commonly known as: TYLENOL Take 2 tablets (650 mg total) by mouth every 6 (six) hours as needed for mild pain (or Fever >/= 101).   bisacodyl 5 MG EC tablet Commonly known as: DULCOLAX Take 1 tablet (5 mg total) by mouth daily as needed for moderate constipation.   cyanocobalamin 1000 MCG tablet Take 1 tablet (1,000 mcg total) by mouth daily. Start taking on: November 27, 2019   feeding supplement (ENSURE ENLIVE) Liqd Take 237 mLs by mouth 2 (two) times daily between meals.   nutrition supplement (JUVEN) Pack Take 1 packet by mouth 2 (two) times daily between meals.   folic acid 1 MG tablet Commonly known as: FOLVITE Take 1 tablet (1 mg total) by mouth daily. Start taking on: November 27, 2019   magnesium oxide 400 (241.3 Mg) MG tablet Commonly known as: MAG-OX Take 1 tablet (400 mg total) by mouth 2 (two) times daily.   mirtazapine 7.5 MG tablet Commonly known as:  REMERON Take 1 tablet (7.5 mg total) by mouth at bedtime.   multivitamin with minerals Tabs tablet Take 1 tablet by mouth daily. Start taking on: November 27, 2019   nicotine 14 mg/24hr patch Commonly known as: NICODERM CQ - dosed in mg/24 hours Place 1 patch (14 mg total) onto the skin daily. Start taking on: November 27, 2019   ondansetron 4 MG tablet Commonly known as: ZOFRAN Take 1 tablet (4 mg total) by mouth every 6 (six) hours as needed for nausea.   QUEtiapine 50 MG tablet Commonly known as: SEROQUEL Take 1 tablet (50 mg total) by mouth 2 (two) times daily.   thiamine 100 MG tablet Take 1 tablet (100 mg total) by mouth daily. Start taking on: November 27, 2019            Discharge Care Instructions  (From admission, onward)         Start     Ordered   11/26/19 0000  Discharge wound care:       Comments: Per WOC nurse   11/26/19 1237          No Known Allergies  Consultations:  Psychiatry  Procedures/Studies: CT HEAD WO CONTRAST  Result Date: 11/11/2019 CLINICAL DATA:  Altered mental status EXAM: CT HEAD WITHOUT CONTRAST TECHNIQUE: Contiguous axial images were obtained from the base of the skull through the vertex without intravenous contrast. COMPARISON:  11/06/2018 FINDINGS: Brain: Mild atrophic changes are noted. No findings to suggest acute hemorrhage, acute infarction or space-occupying mass lesion are seen. 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 Vascular: No hyperdense vessel or unexpected calcification. Skull: Normal. Negative for fracture or focal lesion. Sinuses/Orbits: No acute finding. Other: None. IMPRESSION: Chronic changes as described above without acute abnormality. Electronically Signed   By: Alcide CleverMark  Lukens M.D.   On: 11/11/2019 12:29   DG Chest Port 1 View  Result Date: 11/11/2019 CLINICAL DATA:  Failure to thrive. EXAM: PORTABLE CHEST 1 VIEW COMPARISON:  Single-view of the chest 11/07/2018. FINDINGS: Lungs clear. Heart size normal.  Atherosclerosis. No pneumothorax or pleural fluid. Remote bilateral rib fractures again seen IMPRESSION:  No acute disease. Aortic Atherosclerosis (ICD10-I70.0). Electronically Signed   By: Drusilla Kanner M.D.   On: 11/11/2019 11:42     Subjective: Seen and examined at bedside and he again is resting in bed and states that he slept very well last night..  Denies any lightheadedness or dizziness.  Thinks his knee is doing a little bit better today.  No other concerns or complaints at this time.  Patient's brother is of sound mind  spoke with the case manager and he is elected for the patient to go home as he is the closest to the patient in terms of family.  Patient's is of sound mind and I spoke with the case worker at length and they will send out an APS consult for the patient and arrange home health charity.  Patient has been medically stable to be discharged to SNF and however no surgical options were available so now patient will go home with 24-hour supervision with his brother and his pastor will check on him frequently.  Currently will need to follow-up with his PCP as well as psychiatry in outpatient setting.   Discharge Exam: Vitals:   11/25/19 2137 11/26/19 0518  BP: 112/83 134/90  Pulse: (!) 106 90  Resp: 18 15  Temp: 98.1 F (36.7 C) (!) 97.5 F (36.4 C)  SpO2: 97% 96%   Vitals:   11/25/19 0402 11/25/19 1305 11/25/19 2137 11/26/19 0518  BP: 129/89 107/76 112/83 134/90  Pulse: 93 (!) 103 (!) 106 90  Resp: 18 16 18 15   Temp: 98.2 F (36.8 C) 98.7 F (37.1 C) 98.1 F (36.7 C) (!) 97.5 F (36.4 C)  TempSrc: Oral Oral Oral Oral  SpO2: 96% 96% 97% 96%  Weight:      Height:       Examination: Physical Exam:  Constitutional: Patient is a thin disheveled Caucasian male currently in no acute distress sitting up in bed watching television Respiratory: Diminished to auscultation but unlabored breathing.  No appreciable wheezing, rales, rhonchi. Cardiovascular: Regular rate  and rhythm.  No appreciable murmurs, rubs, gallop Abdomen: Soft, nontender, nondistended.  Bowel sounds present Skin: No appreciable rashes or lesions on skin evaluation Neurologic: Cranial nerves II through XII grossly intact.  The results of significant diagnostics from this hospitalization (including imaging, microbiology, ancillary and laboratory) are listed below for reference.    Microbiology: No results found for this or any previous visit (from the past 240 hour(s)).   Labs: BNP (last 3 results) No results for input(s): BNP in the last 8760 hours. Basic Metabolic Panel: Recent Labs  Lab 11/20/19 0557 11/21/19 0543 11/25/19 0608  NA 136 135 139  K 3.9 4.0 4.0  CL 103 102 105  CO2 26 25 25   GLUCOSE 96 90 93  BUN 18 16 17   CREATININE 0.59* 0.65 0.62  CALCIUM 9.2 9.4 9.4  MG 1.7 1.9 1.9  PHOS  --  3.8 4.0   Liver Function Tests: Recent Labs  Lab 11/20/19 0557 11/21/19 0543 11/25/19 0608  AST 30 36 41  ALT 27 32 45*  ALKPHOS 44 47 47  BILITOT 0.3 0.4 0.4  PROT 7.4 7.9 7.9  ALBUMIN 2.6* 2.8* 2.8*   No results for input(s): LIPASE, AMYLASE in the last 168 hours. No results for input(s): AMMONIA in the last 168 hours. CBC: Recent Labs  Lab 11/20/19 0557 11/21/19 0543 11/25/19 0608  WBC 6.5 6.6 6.5  NEUTROABS  --  3.7 3.4  HGB 8.4* 8.5* 8.8*  HCT  26.0* 26.1* 27.9*  MCV 101.6* 100.4* 102.6*  PLT 245 253 306   Cardiac Enzymes: No results for input(s): CKTOTAL, CKMB, CKMBINDEX, TROPONINI in the last 168 hours. BNP: Invalid input(s): POCBNP CBG: No results for input(s): GLUCAP in the last 168 hours. D-Dimer No results for input(s): DDIMER in the last 72 hours. Hgb A1c No results for input(s): HGBA1C in the last 72 hours. Lipid Profile No results for input(s): CHOL, HDL, LDLCALC, TRIG, CHOLHDL, LDLDIRECT in the last 72 hours. Thyroid function studies No results for input(s): TSH, T4TOTAL, T3FREE, THYROIDAB in the last 72 hours.  Invalid input(s):  FREET3 Anemia work up No results for input(s): VITAMINB12, FOLATE, FERRITIN, TIBC, IRON, RETICCTPCT in the last 72 hours. Urinalysis    Component Value Date/Time   COLORURINE YELLOW 11/11/2019 1548   APPEARANCEUR CLEAR 11/11/2019 1548   LABSPEC 1.016 11/11/2019 1548   PHURINE 5.0 11/11/2019 1548   GLUCOSEU NEGATIVE 11/11/2019 1548   HGBUR SMALL (A) 11/11/2019 1548   BILIRUBINUR NEGATIVE 11/11/2019 1548   KETONESUR 20 (A) 11/11/2019 1548   PROTEINUR NEGATIVE 11/11/2019 1548   NITRITE NEGATIVE 11/11/2019 1548   LEUKOCYTESUR NEGATIVE 11/11/2019 1548   Sepsis Labs Invalid input(s): PROCALCITONIN,  WBC,  LACTICIDVEN Microbiology No results found for this or any previous visit (from the past 240 hour(s)).  Time coordinating discharge: 35 minutes  SIGNED:  Merlene Laughter, DO [facility] 11/26/2019, 12:38 PM Pager is on AMION  If 7PM-7AM, please contact night-coverage [email or web address]

## 2019-11-26 NOTE — Progress Notes (Addendum)
Physical Therapy Treatment Patient Details Name: Randall Garcia MRN: [id number] DOB: 30-Apr-1966 Today's Date: 11/26/2019    History of Present Illness 54 y.o. male with no known past medical history (never sees doctors) who was brought to the ED via EMS after church members found patient minimally responsive, on his couch in his own urine and feces where he had apparently not moved from for at least 2 weeks. Diagnosis: acute met encephalopahty, rhabdomyolysis.    PT Comments    Pt is progressing well with mobility, he ambulated 250' with RW, no loss of balance, no dizziness, he did report L knee pain starting at last 38' of the walk. He stated the L knee pain is chronic from a work injury 7 years ago, he was unable to rate the pain. 24* supervision recommended due to decreased safety awareness. PT goals were updated.    Follow Up Recommendations  SNF;Supervision/Assistance - 24 hour     Equipment Recommendations  Rolling walker with 5" wheels    Recommendations for Other Services       Precautions / Restrictions Precautions Precautions: Fall Precaution Comments: sacral and right hip wound per RN notes; lacks capacity per psych Restrictions Weight Bearing Restrictions: No    Mobility  Bed Mobility               General bed mobility comments: up in recliner  Transfers Overall transfer level: Needs assistance Equipment used: Rolling walker (2 wheeled) Transfers: Sit to/from Stand Sit to Stand: Supervision         General transfer comment: cues for safe hand placement and to scoot forward in recliner prior to standing, increased time and repeated VCs required for pt to follow commands  Ambulation/Gait Ambulation/Gait assistance: Supervision Gait Distance (Feet): 250 Feet Assistive device: Rolling walker (2 wheeled) Gait Pattern/deviations: Trunk flexed;Decreased stride length Gait velocity: decreased   General Gait Details: pt denied dizziness, initially denied  pain with walking, but last 40' of walk he reported L knee pain which he was not able to rate (he stated this is chronic pain from a work injury 7 years ago, 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)   Marine scientist [place] (Stroke Patients Only)       Balance Overall balance assessment: Needs assistance Sitting-balance support: No upper extremity supported;Feet supported Sitting balance-Leahy Scale: Good     Standing balance support: Bilateral upper extremity supported;During functional activity Standing balance-Leahy Scale: Fair Standing balance comment: required use of RW                            Cognition Arousal/Alertness: Awake/alert Behavior During Therapy: WFL for tasks assessed/performed Overall Cognitive Status: No family/caregiver present to determine baseline cognitive functioning Area of Impairment: Safety/judgement;Orientation                 Orientation Level: Disoriented to;Time;Situation   Memory: Decreased short-term memory Following Commands: Follows one step commands consistently Safety/Judgement: Decreased awareness of safety;Decreased awareness of deficits     General Comments: increased time for mobility and to process commands      Exercises      General Comments        Pertinent Vitals/Pain Faces Pain Scale: Hurts little more (When asked to rate pain, pt stated, "I couldn't really tell you.") Pain Location: L knee with prolonged walking, no pain at rest Pain  Descriptors / Indicators: Discomfort Pain Intervention(s): Limited activity within patient's tolerance;Monitored during session    Home Living                      Prior Function            PT Goals (current goals can now be found in the care plan section) Acute Rehab PT Goals Patient Stated Goal: to get stronger PT Goal Formulation: With patient Time For Goal Achievement: 12/10/19 Potential to Achieve Goals:  Fair Progress towards PT goals: Goals met and updated - see care plan    Frequency    Min 2X/week      PT Plan Current plan remains appropriate    Co-evaluation              AM-PAC PT "6 Clicks" Mobility   Outcome Measure  Help needed turning from your back to your side while in a flat bed without using bedrails?: None Help needed moving from lying on your back to sitting on the side of a flat bed without using bedrails?: A Little Help needed moving to and from a bed to a chair (including a wheelchair)?: A Little Help needed standing up from a chair using your arms (e.g., wheelchair or bedside chair)?: A Little Help needed to walk in hospital room?: A Little Help needed climbing 3-5 steps with a railing? : A Lot 6 Click Score: 18    End of Session Equipment Utilized During Treatment: Gait belt Activity Tolerance: Patient tolerated treatment well Patient left: in chair;with chair alarm set;with call bell/phone within reach Nurse Communication: Mobility status PT Visit Diagnosis: Muscle weakness (generalized) (M62.81);Difficulty in walking, not elsewhere classified (R26.2)     Time: 1156-1209 PT Time Calculation (min) (ACUTE ONLY): 13 min  Charges:  $Gait Training: 8-22 mins                    ,  Kistler PT 11/26/2019  Acute Rehabilitation Services Pager [phone] Office [phone]   

## 2019-11-26 NOTE — Progress Notes (Signed)
Spoke w Grace Isaac about pt discharge and supervision / care at home. No further questions at this time.   Brett Canales picked up patient in front of [facility] and given paper scripts, AVS, and MATCH card
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
## 2019-11-26 NOTE — NC FL2 (Signed)
[place] MEDICAID FL2 LEVEL OF CARE SCREENING TOOL     IDENTIFICATION  Patient Name: MANDRELL VANGILDER Birthdate: 10/05/1965 Sex: male Admission Date (Current Location): 11/11/2019  [facility] and [place] Number:  Producer, television/film/video and Address:  [facility],  [address] [place], [place] [address]      Provider Number: [id number]  Attending Physician Name and Address:  Merlene Laughter, DO  Relative Name and Phone Number:       Current Level of Care: Hospital Recommended Level of Care: Skilled Nursing Facility Prior Approval Number:    Date Approved/Denied:   PASRR Number:    Discharge Plan: SNF    Current Diagnoses: Patient Active Problem List   Diagnosis Date Noted   Pressure injury of skin 11/12/2019   Malnutrition of moderate degree 11/12/2019   Rhabdomyolysis 11/11/2019   Hypokalemia 11/11/2019   Hypomagnesemia 11/11/2019   Acute metabolic encephalopathy 11/11/2019   Catatonia 11/11/2019   Dehydration 11/11/2019   Left arm cellulitis 02/17/2019   Hyponatremia 02/17/2019   Alcohol abuse 02/17/2019    Orientation RESPIRATION BLADDER Height & Weight     Self, Place  Normal Continent Weight: 54.1 kg Height:  5\' 11"  (180.3 cm)  BEHAVIORAL SYMPTOMS/MOOD NEUROLOGICAL BOWEL NUTRITION STATUS      Continent Diet  AMBULATORY STATUS COMMUNICATION OF NEEDS Skin   Limited Assist Verbally Skin abrasions, Bruising                       Personal Care Assistance Level of Assistance  Bathing, Dressing Bathing Assistance: Limited assistance   Dressing Assistance: Limited assistance     Functional Limitations Info             SPECIAL CARE FACTORS FREQUENCY  PT (By licensed PT), OT (By licensed OT)     PT Frequency: 5 x weekly OT Frequency: 5 x weekly            Contractures Contractures Info: Not present    Additional Factors Info  Code Status, Allergies Code Status Info: full Allergies Info: No known drug allergies            Current Medications (11/26/2019):  This is the current hospital active medication list Current Facility-Administered Medications  Medication Dose Route Frequency Provider Last Rate Last Admin   acetaminophen (TYLENOL) tablet 650 mg  650 mg Oral Q6H PRN 01/27/2020 A, DO   650 mg at 11/23/19 1806   Or   acetaminophen (TYLENOL) suppository 650 mg  650 mg Rectal Q6H PRN 1807 A, DO       bisacodyl (DULCOLAX) EC tablet 5 mg  5 mg Oral Daily PRN Esaw Grandchild A, DO       enoxaparin (LOVENOX) injection 40 mg  40 mg Subcutaneous Q24H Esaw Grandchild A, DO   40 mg at 11/25/19 2124   feeding supplement (ENSURE ENLIVE) (ENSURE ENLIVE) liquid 237 mL  237 mL Oral BID BM 2125 A, DO   237 mL at 11/25/19 1430   folic acid (FOLVITE) tablet 1 mg  1 mg Oral Daily 01/26/20 T, RPH   1 mg at 11/25/19 1030   hydrALAZINE (APRESOLINE) tablet 25 mg  25 mg Oral Q6H PRN 01/26/20 A, DO       magnesium oxide (MAG-OX) tablet 400 mg  400 mg Oral BID Esaw Grandchild, MD   400 mg at 11/25/19 2124   mirtazapine (REMERON) tablet 7.5 mg  7.5 mg Oral QHS 2125  Latif, DO   7.5 mg at 11/25/19 2124   multivitamin with minerals tablet 1 tablet  1 tablet Oral Daily Esaw Grandchild A, DO   1 tablet at 11/25/19 1031   nicotine (NICODERM CQ - dosed in mg/24 hours) patch 14 mg  14 mg Transdermal Daily Osvaldo Shipper, MD   14 mg at 11/25/19 1038   nutrition supplement (JUVEN) (JUVEN) powder packet 1 packet  1 packet Oral BID BM Osvaldo Shipper, MD   1 packet at 11/25/19 1031   ondansetron (ZOFRAN) tablet 4 mg  4 mg Oral Q6H PRN Esaw Grandchild A, DO       Or   ondansetron (ZOFRAN) injection 4 mg  4 mg Intravenous Q6H PRN Esaw Grandchild A, DO       QUEtiapine (SEROQUEL) tablet 50 mg  50 mg Oral BID Osvaldo Shipper, MD   50 mg at 11/25/19 2124   senna-docusate (Senokot-S) tablet 1 tablet  1 tablet Oral QHS PRN Esaw Grandchild A, DO       thiamine tablet 100 mg  100 mg  Oral Daily Osvaldo Shipper, MD   100 mg at 11/25/19 1031   vitamin B-12 (CYANOCOBALAMIN) tablet 1,000 mcg  1,000 mcg Oral Daily Osvaldo Shipper, MD   1,000 mcg at 11/25/19 1031     Discharge Medications: Please see discharge summary for a list of discharge medications.  Relevant Imaging Results:  Relevant Lab Results:   Additional Information [id number]  Bohdi Leeds, Meriam Sprague, RN

## 2021-04-30 ENCOUNTER — Other Ambulatory Visit: Payer: Self-pay

## 2021-04-30 ENCOUNTER — Emergency Department (HOSPITAL_BASED_OUTPATIENT_CLINIC_OR_DEPARTMENT_OTHER)
Admission: EM | Admit: 2021-04-30 | Discharge: 2021-04-30 | Disposition: A | Discharge status: Home / Self Care | Payer: Medicaid Other | Attending: Emergency Medicine | Admitting: Emergency Medicine

## 2021-04-30 ENCOUNTER — Encounter (HOSPITAL_BASED_OUTPATIENT_CLINIC_OR_DEPARTMENT_OTHER): Payer: Self-pay | Admitting: Emergency Medicine

## 2021-04-30 DIAGNOSIS — F17200 Nicotine dependence, unspecified, uncomplicated: Secondary | ICD-10-CM | POA: Diagnosis not present

## 2021-04-30 DIAGNOSIS — W228XXA Striking against or struck by other objects, initial encounter: Secondary | ICD-10-CM | POA: Insufficient documentation

## 2021-04-30 DIAGNOSIS — S61412A Laceration without foreign body of left hand, initial encounter: Secondary | ICD-10-CM | POA: Diagnosis not present

## 2021-04-30 DIAGNOSIS — Z23 Encounter for immunization: Secondary | ICD-10-CM | POA: Insufficient documentation

## 2021-04-30 DIAGNOSIS — S61419A Laceration without foreign body of unspecified hand, initial encounter: Secondary | ICD-10-CM

## 2021-04-30 DIAGNOSIS — S6992XA Unspecified injury of left wrist, hand and finger(s), initial encounter: Secondary | ICD-10-CM | POA: Diagnosis present

## 2021-04-30 MED ORDER — TETANUS-DIPHTH-ACELL PERTUSSIS 5-2.5-18.5 LF-MCG/0.5 IM SUSY
0.5000 mL | PREFILLED_SYRINGE | Freq: Once | INTRAMUSCULAR | Status: AC
  Administered 2021-04-30: 0.5 mL via INTRAMUSCULAR
  Filled 2021-04-30: qty 0.5

## 2021-04-30 NOTE — ED Notes (Signed)
Pt discharged to home. Discharge instructions have been discussed with patient and/or family members. Pt verbally acknowledges understanding d/c instructions, and endorses comprehension to checkout at registration before leaving.  °
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
## 2021-04-30 NOTE — Discharge Instructions (Addendum)
Follow-up with your doctor needed.  Watch for signs of infection
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
## 2021-09-06 IMAGING — CT CT HEAD W/O CM
3 series · 14 of 47 positions shown, 16 images · non-contrast
Comparison: 11/06/2018

CLINICAL DATA: Altered mental status

EXAM:
CT HEAD WITHOUT CONTRAST
TECHNIQUE: Contiguous axial images were obtained from the base of the skull
through the vertex without intravenous contrast.

[Series 2: head wo · axial · 0.47mm/px · z∈[-126,-1]mm · 8 of 31 slices shown, 10 images]
[im 3/31  brain]
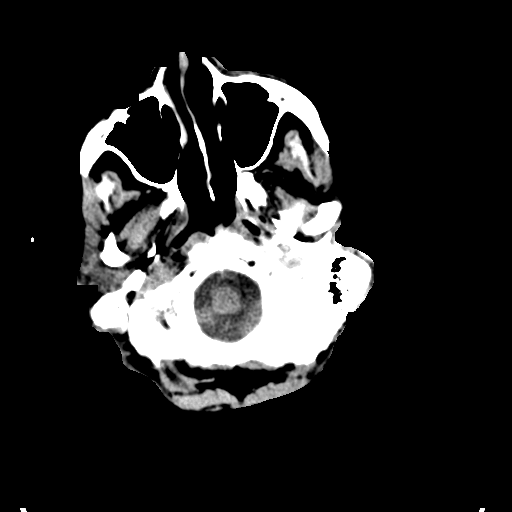
[im 3/31  bone]
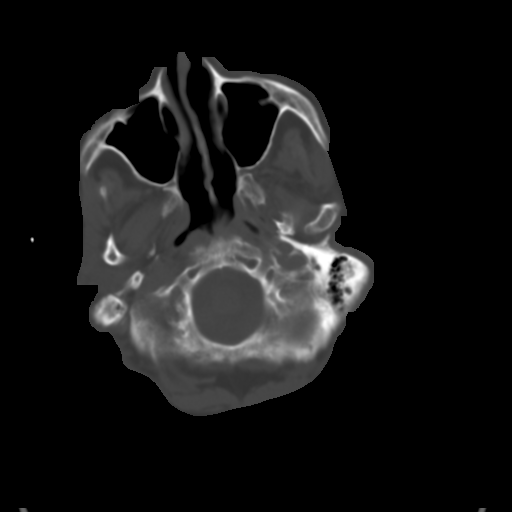
[im 7/31  brain]
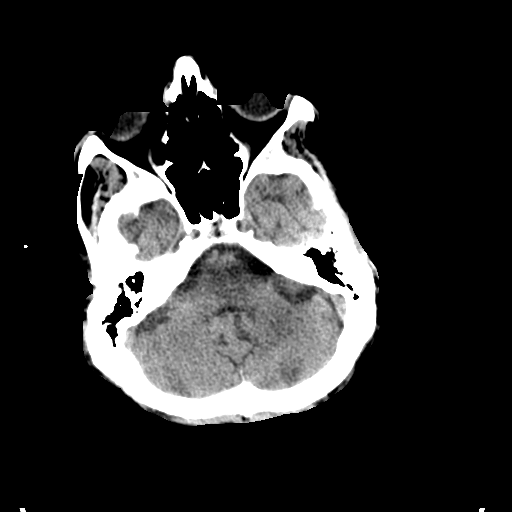
[im 10/31  brain]
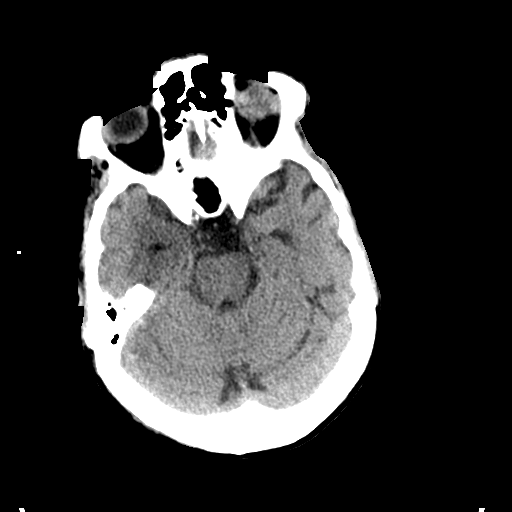
[im 14/31  brain]
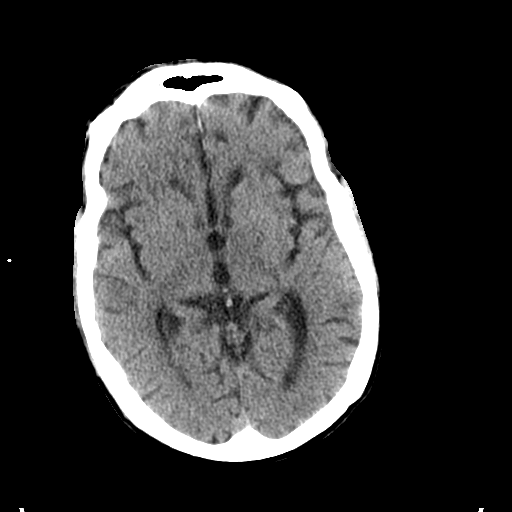
[im 17/31  brain]
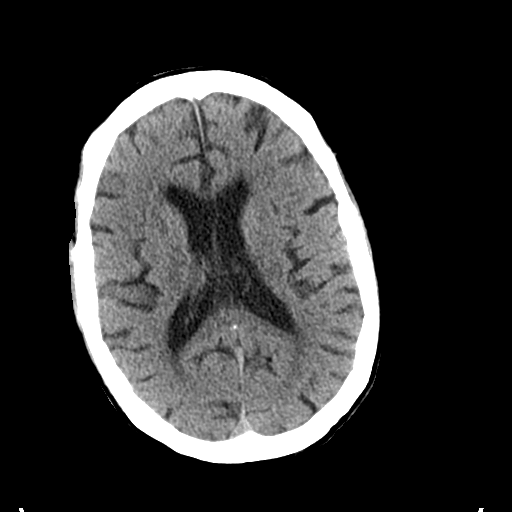
[im 17/31  bone]
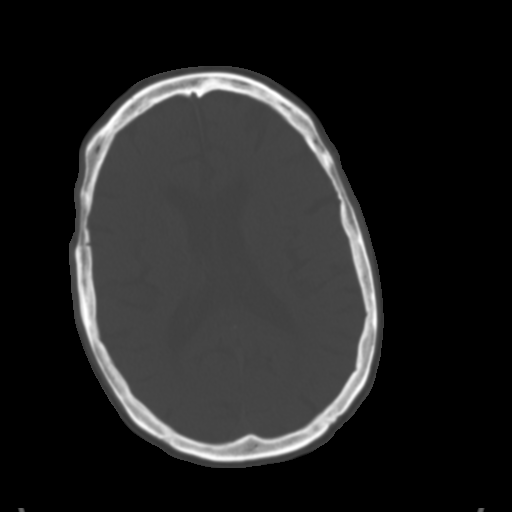
[im 21/31  brain]
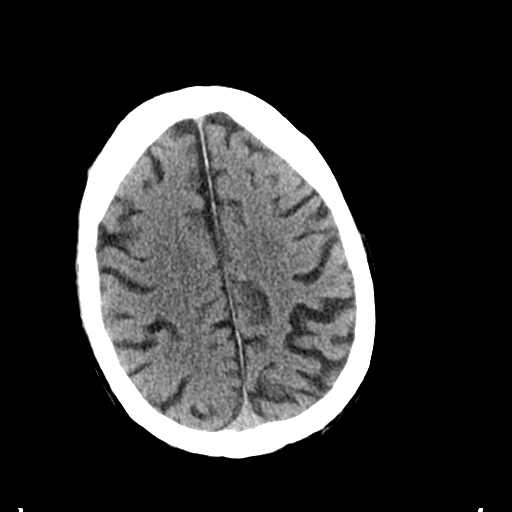
[im 24/31  brain]
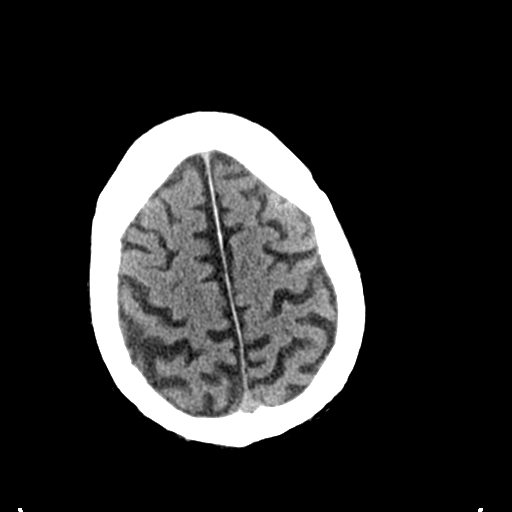
[im 28/31  brain]
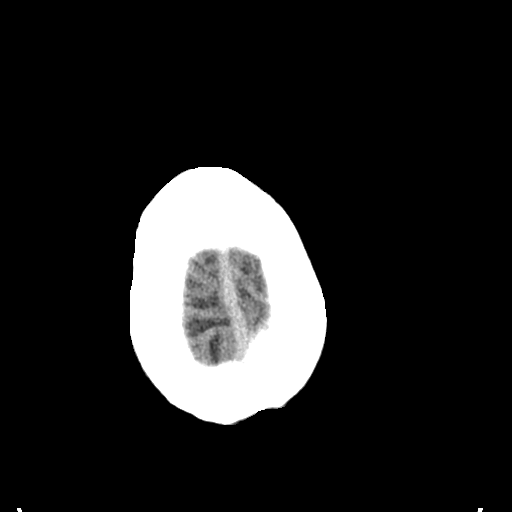

[Series 4: coronal soft tissue · coronal · 0.35mm/px · 3 of 66 slices shown]
[im 22/66  brain]
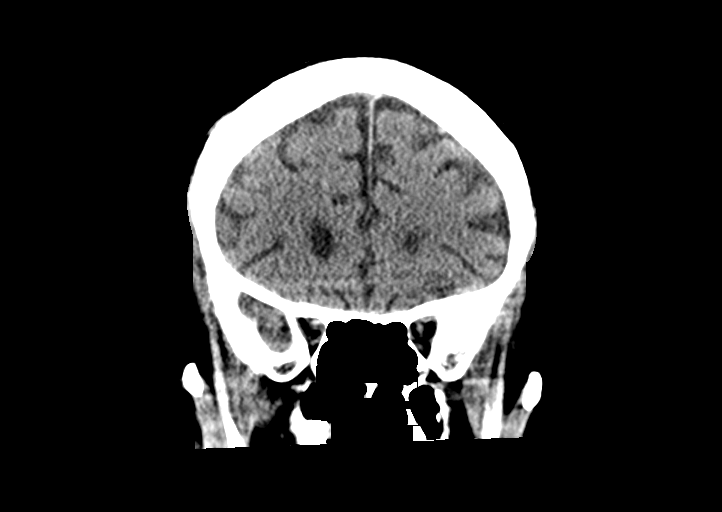
[im 29/66  brain]
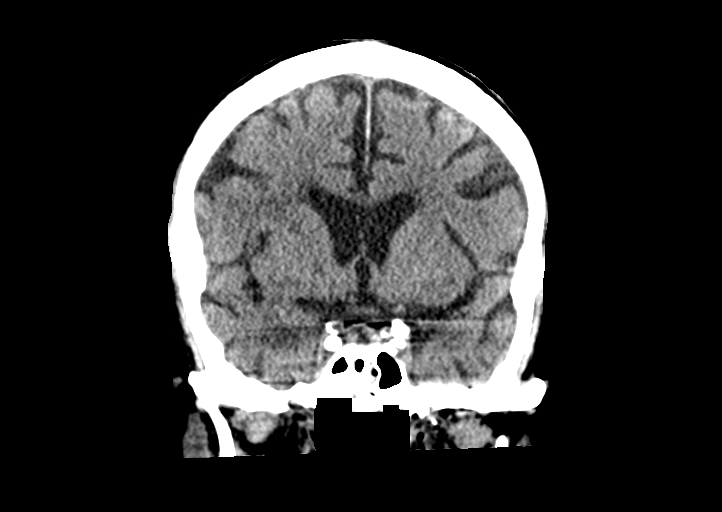
[im 37/66  brain]
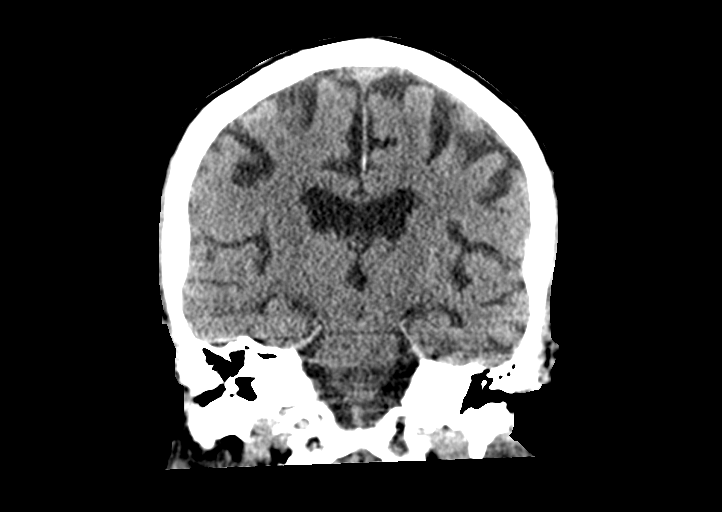

[Series 5: sagittal soft tissue · sagittal · 0.35mm/px · 3 of 48 slices shown]
[im 16/48  brain]
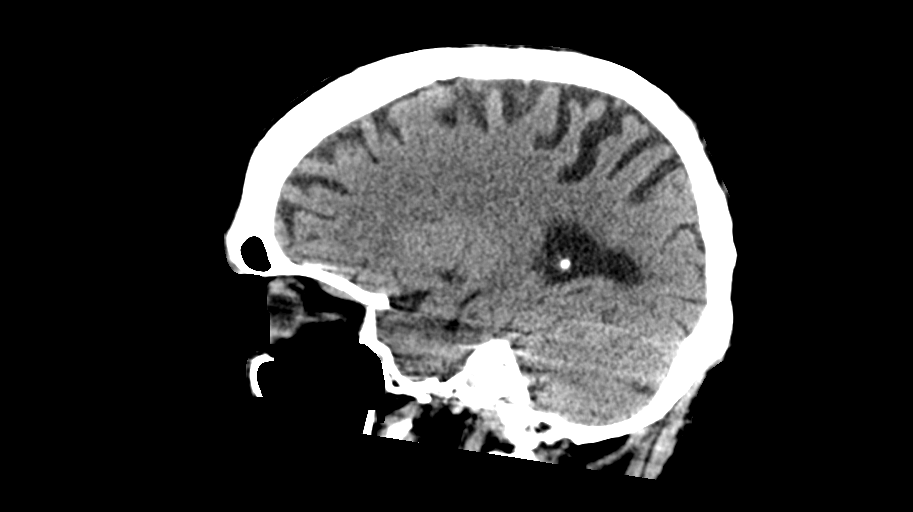
[im 24/48  brain]
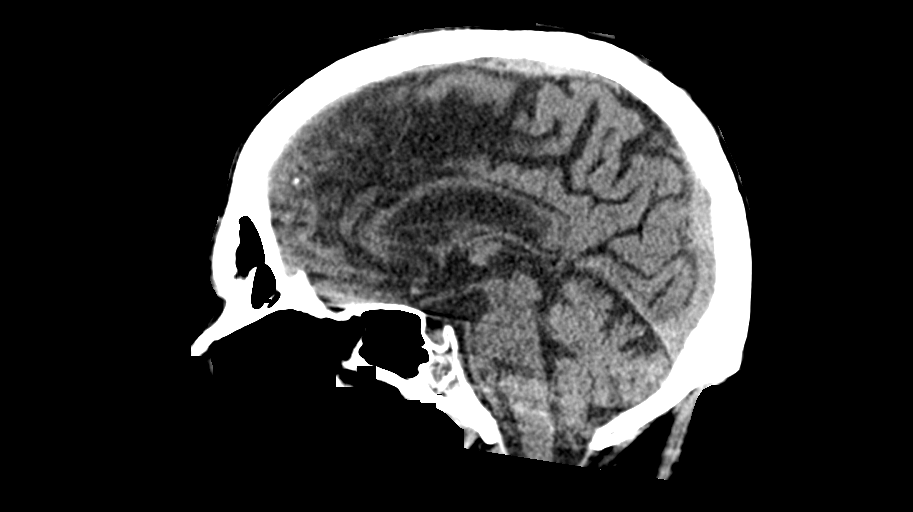
[im 32/48  brain]
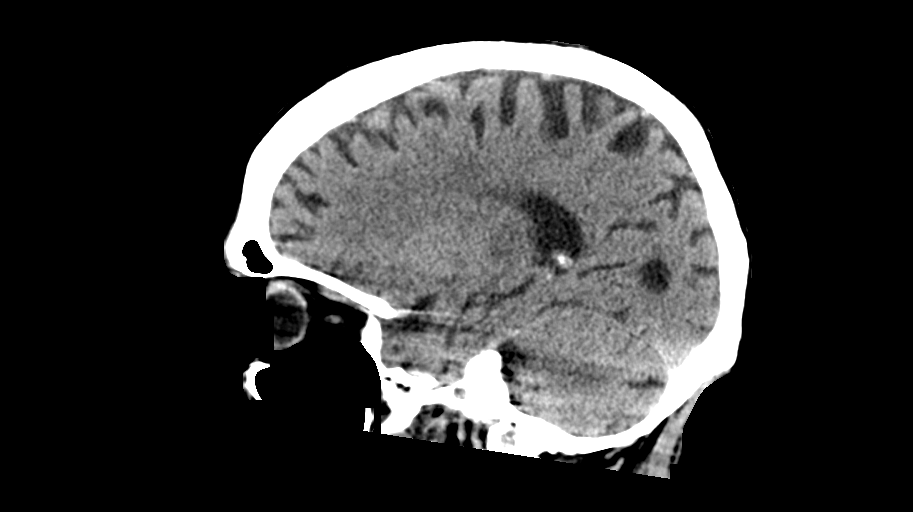

[14 of 47 positions shown; findings below may reference images not displayed]

FINDINGS: Brain: Mild atrophic changes are noted. No findings to suggest acute
hemorrhage, acute infarction or space-occupying mass lesion are
seen. Old lacunar infarct in the right basal ganglia is seen.

Vascular: No hyperdense vessel or unexpected calcification.

Skull: Normal. Negative for fracture or focal lesion.

Sinuses/Orbits: No acute finding.

Other: None.
IMPRESSION: Chronic changes as described above without acute abnormality.

## 2024-03-22 ENCOUNTER — Other Ambulatory Visit: Payer: Self-pay

## 2024-03-22 ENCOUNTER — Encounter (HOSPITAL_COMMUNITY): Payer: Self-pay

## 2024-03-22 ENCOUNTER — Emergency Department (HOSPITAL_COMMUNITY): Payer: MEDICAID

## 2024-03-22 ENCOUNTER — Emergency Department (HOSPITAL_COMMUNITY)
Admission: EM | Admit: 2024-03-22 | Discharge: 2024-03-22 | Disposition: A | Discharge status: Home / Self Care | Payer: MEDICAID | Attending: Emergency Medicine | Admitting: Emergency Medicine

## 2024-03-22 DIAGNOSIS — L02511 Cutaneous abscess of right hand: Secondary | ICD-10-CM | POA: Insufficient documentation

## 2024-03-22 DIAGNOSIS — S60221A Contusion of right hand, initial encounter: Secondary | ICD-10-CM | POA: Insufficient documentation

## 2024-03-22 DIAGNOSIS — L03113 Cellulitis of right upper limb: Secondary | ICD-10-CM | POA: Diagnosis not present

## 2024-03-22 DIAGNOSIS — M79642 Pain in left hand: Secondary | ICD-10-CM | POA: Diagnosis present

## 2024-03-22 DIAGNOSIS — L02519 Cutaneous abscess of unspecified hand: Secondary | ICD-10-CM

## 2024-03-22 DIAGNOSIS — S60222A Contusion of left hand, initial encounter: Secondary | ICD-10-CM | POA: Diagnosis not present

## 2024-03-22 MED ORDER — ACETAMINOPHEN 500 MG PO TABS
1000.0000 mg | ORAL_TABLET | Freq: Once | ORAL | Status: AC
  Administered 2024-03-22: 1000 mg via ORAL
  Filled 2024-03-22: qty 2

## 2024-03-22 MED ORDER — CEPHALEXIN 500 MG PO CAPS: 500.0000 mg | ORAL_CAPSULE | Freq: Three times a day (TID) | ORAL | 0 refills | Status: AC

## 2024-03-22 MED ORDER — CEPHALEXIN 250 MG PO CAPS
500.0000 mg | ORAL_CAPSULE | Freq: Once | ORAL | Status: AC
  Administered 2024-03-22: 500 mg via ORAL
  Filled 2024-03-22: qty 2

## 2024-03-22 MED ORDER — IBUPROFEN 400 MG PO TABS
400.0000 mg | ORAL_TABLET | Freq: Once | ORAL | Status: AC
  Administered 2024-03-22: 400 mg via ORAL
  Filled 2024-03-22: qty 1
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
## 2024-03-22 NOTE — ED Notes (Signed)
Pt verbalizes understanding of DC instructions. Pt belongings returned and is ambulatory out of ED.  
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
